# Patient Record
Sex: Male | Born: 1985 | Race: Black or African American | Hispanic: No | Marital: Single | Smoking: Former smoker
Health system: Southern US, Community
[De-identification: ages and names within clinical notes are randomized; demographics above are authoritative.]

## PROBLEM LIST (undated history)

## (undated) DIAGNOSIS — I10 Essential (primary) hypertension: Secondary | ICD-10-CM

## (undated) DIAGNOSIS — J40 Bronchitis, not specified as acute or chronic: Secondary | ICD-10-CM

---
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
## 2005-04-06 ENCOUNTER — Emergency Department (HOSPITAL_COMMUNITY): Admission: EM | Admit: 2005-04-06 | Discharge: 2005-04-06 | Payer: Self-pay | Admitting: Emergency Medicine

## 2005-07-21 ENCOUNTER — Emergency Department (HOSPITAL_COMMUNITY): Admission: EM | Admit: 2005-07-21 | Discharge: 2005-07-21 | Payer: Self-pay | Admitting: Emergency Medicine

## 2006-02-04 ENCOUNTER — Emergency Department (HOSPITAL_COMMUNITY): Admission: EM | Admit: 2006-02-04 | Discharge: 2006-02-04 | Payer: Self-pay | Admitting: Emergency Medicine

## 2006-10-08 ENCOUNTER — Emergency Department (HOSPITAL_COMMUNITY): Admission: EM | Admit: 2006-10-08 | Discharge: 2006-10-08 | Payer: Self-pay | Admitting: Emergency Medicine

## 2008-03-01 ENCOUNTER — Emergency Department (HOSPITAL_COMMUNITY): Admission: EM | Admit: 2008-03-01 | Discharge: 2008-03-01 | Payer: Self-pay | Admitting: Emergency Medicine

## 2008-05-28 ENCOUNTER — Emergency Department (HOSPITAL_COMMUNITY): Admission: EM | Admit: 2008-05-28 | Discharge: 2008-05-28 | Payer: Self-pay | Admitting: Emergency Medicine

## 2009-04-10 ENCOUNTER — Emergency Department (HOSPITAL_COMMUNITY): Admission: EM | Admit: 2009-04-10 | Discharge: 2009-04-10 | Payer: Self-pay | Admitting: Emergency Medicine

## 2009-10-02 ENCOUNTER — Emergency Department (HOSPITAL_COMMUNITY): Admission: EM | Admit: 2009-10-02 | Discharge: 2009-10-02 | Payer: Self-pay | Admitting: Emergency Medicine

## 2010-10-18 ENCOUNTER — Emergency Department (HOSPITAL_COMMUNITY): Admit: 2010-10-18 | Discharge: 2010-10-18 | Disposition: A | Discharge status: Home / Self Care | Payer: Self-pay

## 2010-10-18 ENCOUNTER — Emergency Department (HOSPITAL_COMMUNITY): Admission: EM | Admit: 2010-10-18 | Payer: Self-pay | Source: Home / Self Care

## 2010-10-18 ENCOUNTER — Emergency Department (HOSPITAL_COMMUNITY)
Admission: EM | Admit: 2010-10-18 | Discharge: 2010-10-18 | Disposition: A | Discharge status: Home / Self Care | Payer: Self-pay | Attending: Emergency Medicine | Admitting: Emergency Medicine

## 2010-10-18 DIAGNOSIS — R42 Dizziness and giddiness: Secondary | ICD-10-CM | POA: Insufficient documentation

## 2010-10-18 DIAGNOSIS — J020 Streptococcal pharyngitis: Secondary | ICD-10-CM | POA: Insufficient documentation

## 2010-10-18 DIAGNOSIS — R05 Cough: Secondary | ICD-10-CM | POA: Insufficient documentation

## 2010-10-18 DIAGNOSIS — R599 Enlarged lymph nodes, unspecified: Secondary | ICD-10-CM | POA: Insufficient documentation

## 2010-10-18 DIAGNOSIS — R059 Cough, unspecified: Secondary | ICD-10-CM | POA: Insufficient documentation

## 2010-10-18 DIAGNOSIS — R091 Pleurisy: Secondary | ICD-10-CM | POA: Insufficient documentation

## 2010-10-18 DIAGNOSIS — R079 Chest pain, unspecified: Secondary | ICD-10-CM | POA: Insufficient documentation

## 2010-10-18 DIAGNOSIS — R51 Headache: Secondary | ICD-10-CM | POA: Insufficient documentation

## 2010-10-18 DIAGNOSIS — R6883 Chills (without fever): Secondary | ICD-10-CM | POA: Insufficient documentation

## 2010-10-18 DIAGNOSIS — R0602 Shortness of breath: Secondary | ICD-10-CM | POA: Insufficient documentation

## 2010-10-18 DIAGNOSIS — J45909 Unspecified asthma, uncomplicated: Secondary | ICD-10-CM | POA: Insufficient documentation

## 2010-10-18 DIAGNOSIS — F17200 Nicotine dependence, unspecified, uncomplicated: Secondary | ICD-10-CM | POA: Insufficient documentation

## 2010-10-18 LAB — RAPID STREP SCREEN (MED CTR MEBANE ONLY): Streptococcus, Group A Screen (Direct): POSITIVE — AB

## 2010-10-30 ENCOUNTER — Emergency Department (HOSPITAL_COMMUNITY)
Admission: EM | Admit: 2010-10-30 | Discharge: 2010-10-30 | Disposition: A | Discharge status: Home / Self Care | Payer: Self-pay | Attending: Emergency Medicine | Admitting: Emergency Medicine

## 2010-10-30 DIAGNOSIS — R509 Fever, unspecified: Secondary | ICD-10-CM | POA: Insufficient documentation

## 2010-10-30 DIAGNOSIS — R112 Nausea with vomiting, unspecified: Secondary | ICD-10-CM | POA: Insufficient documentation

## 2010-10-30 DIAGNOSIS — R51 Headache: Secondary | ICD-10-CM | POA: Insufficient documentation

## 2010-10-30 DIAGNOSIS — J209 Acute bronchitis, unspecified: Secondary | ICD-10-CM | POA: Insufficient documentation

## 2010-10-30 DIAGNOSIS — F17200 Nicotine dependence, unspecified, uncomplicated: Secondary | ICD-10-CM | POA: Insufficient documentation

## 2011-01-31 NOTE — Discharge Summary (Signed)
[facility]  Patient:    Robert Drake, Robert Drake Visit Number: [id number] MRN: [id number]          Service Type: MED Location: 3A A327 01 Attending Physician:  Milana Obey Dictated by:   John Giovanni, M.D. Admit Date:  06/29/2001 Discharge Date: 06/30/2001                             Discharge Summary  BRIEF HISTORY:  This 25 year old was admitted to the hospital with asthma.  He had a benign 2 day hospital course extending from June 29, 2001, to June 30, 2001.  His x-rays showed some bronchitic change with some questionable prominence with right perihilar markings.  CBC and Met-7 were essentially normal.  He did not respond to Ventolin in the ER and therefore he was considered to be in status asthmaticus and was put on continued nebulizer treatment with Ventolin initially and then albuterol treatments q.2-4 hours.  He was also given Rocephin 1 gram IV q.24h. and put on Solu-Medrol 125 mg IV q.6h.. after already receiving 60 mg of prednisone in the ER.  He was on Tylenol p.r.n. and IV normal saline at 50 cc/hr.  He still had wheezing the night of admission but by the following morning he was totally clear, felt well with oxygen saturations at 97%.  He was felt to be stable to send home later in the day and arrangements were made for same.  DISCHARGE MEDICATIONS:  He is to take: 1. Ceftin 250 b.i.d. for 10 days (#20, no refills). 2. Prednisone 10 mg b.i.d. (#30, no refills). 3. Albuterol nebulizer treatments q.i.d. (refill for the year with an    prescription for a nebulizer as well). 4. He is to continue on singulair 10 mg q.d. 5. Use flovent 2 puffs b.i.d. at present.  FOLLOWUP:  Follow up will be with me in the office in 2 days.  FINAL DISCHARGE DIAGNOSES: 1. Status asthmaticus. 2. Question pneumonia. Dictated by:   John Giovanni, M.D. Attending Physician:  Milana Obey DD:  06/30/01 TD:  06/30/01 Job:  [id number]

## 2011-01-31 NOTE — H&P (Signed)
[facility]  Patient:    Robert Drake, Robert Drake Visit Number: [id number] MRN: [id number]          Service Type: MED Location: 3A A327 01 Attending Physician:  Hilario Quarry Dictated by:   John Giovanni, M.D. Admit Date:  06/29/2001                           History and Physical  HISTORY OF PRESENT ILLNESS:  A 25 year old who became sick about three days ago initially with a sore throat. He is on Singulair 10 mg q.d. as a preventive for asthma. At that time, we added Flovent two puffs b.i.d. He began to get worse yesterday. He started to wheeze and had to use Albuterol inhalers and today became short of breath enough that he had to present to the ER.  He has a long history of asthma, admissions for pneumonia.  OBJECTIVE:  VITAL SIGNS:  Temperature 97.6, pulse 96, respiratory rate 24, blood pressure 127/61, O2 sat was 97% on room air. Recheck 96% room air.  GENERAL:  At the time of my exam, he was cooperative, oriented, alert, well-developed, well-nourished, in fact somewhat obese in no acute distress.  CHEST:  There were expiratory rhonchi heard throughout the chest but is moving air well, no intercostal traction. No use of accessory muscles for respiration, breathing easily without O2 on room air.  HEART:  Regular rhythm without murmur, rate about 70.  ABDOMEN:  Soft without hepatosplenomegaly or mass, no tenderness.  EXTREMITIES:  Normal. Skin turgor normal.  Mucous membranes moist. ______.  Chest x-ray showed bronchitic changes with a question of a mild perihilar infiltrate in the superior aspect of the right pulmonary hilum. Blood work is pending.  He was treated with a number of albuterol treatments in the ER and continued to have wheezing. He was given prednisone 60 mg.  ASSESSMENT:  Status asthmaticus.  PLAN:  Solu-Medrol IV. Rocephin 1 mg IV q. 24 hours. Albuterol ______ neb treatment for an hour and then neb treatments q. 4h.  Repeat CBC, ______ in a.m. Dictated by:   John Giovanni, M.D. Attending Physician:  Hilario Quarry DD:  06/29/01 TD:  06/29/01 Job: [id number]

## 2011-08-24 ENCOUNTER — Emergency Department (HOSPITAL_COMMUNITY)
Admission: EM | Admit: 2011-08-24 | Discharge: 2011-08-24 | Disposition: A | Discharge status: Home / Self Care | Payer: Self-pay | Attending: Emergency Medicine | Admitting: Emergency Medicine

## 2011-08-24 ENCOUNTER — Encounter: Payer: Self-pay | Admitting: *Deleted

## 2011-08-24 DIAGNOSIS — F17200 Nicotine dependence, unspecified, uncomplicated: Secondary | ICD-10-CM | POA: Insufficient documentation

## 2011-08-24 DIAGNOSIS — K047 Periapical abscess without sinus: Secondary | ICD-10-CM | POA: Insufficient documentation

## 2011-08-24 DIAGNOSIS — J45909 Unspecified asthma, uncomplicated: Secondary | ICD-10-CM | POA: Insufficient documentation

## 2011-08-24 MED ORDER — HYDROCODONE-ACETAMINOPHEN 5-325 MG PO TABS: 1.0000 | ORAL_TABLET | Freq: Once | ORAL | Status: DC

## 2011-08-24 MED ORDER — AMOXICILLIN 250 MG PO CAPS
500.0000 mg | ORAL_CAPSULE | Freq: Once | ORAL | Status: AC
  Administered 2011-08-24: 500 mg via ORAL
  Filled 2011-08-24: qty 2

## 2011-08-24 MED ORDER — AMOXICILLIN 500 MG PO CAPS: 500.0000 mg | ORAL_CAPSULE | Freq: Three times a day (TID) | ORAL | Status: DC

## 2011-08-24 MED ORDER — HYDROCODONE-ACETAMINOPHEN 5-325 MG PO TABS: 1.0000 | ORAL_TABLET | ORAL | Status: AC | PRN

## 2011-08-24 MED ORDER — HYDROCODONE-ACETAMINOPHEN 5-325 MG PO TABS
1.0000 | ORAL_TABLET | Freq: Once | ORAL | Status: AC
  Administered 2011-08-24: 1 via ORAL
  Filled 2011-08-24: qty 1

## 2011-08-24 NOTE — ED Provider Notes (Signed)
History     CSN: [id number] Arrival date & time: 08/24/2011 10:38 AM   First MD Initiated Contact with Patient 08/24/11 1040      Chief Complaint  Patient presents with  . Dental Pain    (Consider location/radiation/quality/duration/timing/severity/associated sxs/prior treatment) Patient is a 25 y.o. male presenting with tooth pain. The history is provided by the patient.  Dental PainThe primary symptoms include mouth pain and dental injury. Primary symptoms do not include headaches, fever, shortness of breath or sore throat. Primary symptoms comment: He lost the filling in a tooth months ago, which has slowly started to have increased pain.  He now has cold sensitivity and swelling over the past 2 days. The symptoms are unchanged. The symptoms occur constantly.  Additional symptoms include: dental sensitivity to temperature, gum swelling, gum tenderness and jaw pain. Additional symptoms do not include: facial swelling, trouble swallowing and ear pain.     He has found no alleviators,  Has tried ibuprofen and tylenol.  Drinking cold drinks make it worse.  Past Medical History  Diagnosis Date  . Asthma     History reviewed. No pertinent past surgical history.  History reviewed. No pertinent family history.  History  Substance Use Topics  . Smoking status: Current Everyday Smoker -- 0.5 packs/day  . Smokeless tobacco: Not on file  . Alcohol Use: No      Review of Systems  Constitutional: Negative for fever.  HENT: Negative for ear pain, congestion, sore throat, facial swelling, trouble swallowing and neck pain.   Eyes: Negative.   Respiratory: Negative for chest tightness and shortness of breath.   Cardiovascular: Negative for chest pain.  Gastrointestinal: Negative for nausea and abdominal pain.  Genitourinary: Negative.   Musculoskeletal: Negative for joint swelling and arthralgias.  Skin: Negative.  Negative for rash and wound.  Neurological: Negative for dizziness,  weakness, light-headedness, numbness and headaches.  Hematological: Negative.   Psychiatric/Behavioral: Negative.     Allergies  Robitussin  Home Medications   Current Outpatient Rx  Name Route Sig Dispense Refill  . AMOXICILLIN 500 MG PO CAPS Oral Take 1 capsule (500 mg total) by mouth 3 (three) times daily. 21 capsule 0  . HYDROCODONE-ACETAMINOPHEN 5-325 MG PO TABS Oral Take 1 tablet by mouth once. 15 tablet 0    BP 141/79  Pulse 71  Temp(Src) 98.2 F (36.8 C) (Oral)  Resp 18  Ht 5\' 9"  (1.753 m)  Wt 244 lb 8 oz (110.904 kg)  BMI 36.11 kg/m2  SpO2 98%  Physical Exam  Nursing note and vitals reviewed. Constitutional: He is oriented to person, place, and time. He appears well-developed and well-nourished. No distress.  HENT:  Head: Normocephalic and atraumatic.  Right Ear: Tympanic membrane and external ear normal.  Left Ear: Tympanic membrane and external ear normal.  Mouth/Throat: Oropharynx is clear and moist and mucous membranes are normal. No oral lesions. Dental abscesses present.    Eyes: Conjunctivae are normal.  Neck: Normal range of motion. Neck supple.  Cardiovascular: Normal rate, regular rhythm, normal heart sounds and intact distal pulses.   Pulmonary/Chest: Effort normal and breath sounds normal. He has no wheezes.  Abdominal: Soft. Bowel sounds are normal. He exhibits no distension. There is no tenderness.  Musculoskeletal: Normal range of motion.  Lymphadenopathy:    He has no cervical adenopathy.  Neurological: He is alert and oriented to person, place, and time.  Skin: Skin is warm and dry. No erythema.  Psychiatric: He has a normal mood and  affect.    ED Course  Procedures (including critical care time)  Labs Reviewed - No data to display No results found.   1. Dental abscess       MDM  Dental abscess.        Candis Musa, PA 08/24/11 1109

## 2011-08-24 NOTE — ED Notes (Signed)
C/o left lower toothache since yesterday but worse this am.

## 2011-08-25 NOTE — ED Provider Notes (Signed)
Medical screening examination/treatment/procedure(s) were performed by non-physician practitioner and as supervising physician I was immediately available for consultation/collaboration.   Kashton Mcartor W. Manford Sprong, MD 08/25/11 1746 

## 2011-09-03 ENCOUNTER — Encounter (HOSPITAL_COMMUNITY): Payer: Self-pay | Admitting: Emergency Medicine

## 2011-09-03 ENCOUNTER — Emergency Department (HOSPITAL_COMMUNITY)
Admission: EM | Admit: 2011-09-03 | Discharge: 2011-09-03 | Disposition: A | Discharge status: Home / Self Care | Payer: Self-pay | Attending: Emergency Medicine | Admitting: Emergency Medicine

## 2011-09-03 DIAGNOSIS — J111 Influenza due to unidentified influenza virus with other respiratory manifestations: Secondary | ICD-10-CM | POA: Insufficient documentation

## 2011-09-03 DIAGNOSIS — Z79899 Other long term (current) drug therapy: Secondary | ICD-10-CM | POA: Insufficient documentation

## 2011-09-03 DIAGNOSIS — J40 Bronchitis, not specified as acute or chronic: Secondary | ICD-10-CM | POA: Insufficient documentation

## 2011-09-03 DIAGNOSIS — F17200 Nicotine dependence, unspecified, uncomplicated: Secondary | ICD-10-CM | POA: Insufficient documentation

## 2011-09-03 DIAGNOSIS — J45909 Unspecified asthma, uncomplicated: Secondary | ICD-10-CM | POA: Insufficient documentation

## 2011-09-03 MED ORDER — ACETAMINOPHEN 325 MG PO TABS
650.0000 mg | ORAL_TABLET | Freq: Once | ORAL | Status: AC
  Administered 2011-09-03: 650 mg via ORAL
  Filled 2011-09-03: qty 2

## 2011-09-03 MED ORDER — PREDNISONE 20 MG PO TABS
60.0000 mg | ORAL_TABLET | Freq: Once | ORAL | Status: AC
  Administered 2011-09-03: 60 mg via ORAL
  Filled 2011-09-03: qty 3

## 2011-09-03 MED ORDER — PREDNISONE 10 MG PO TABS: ORAL_TABLET | ORAL | Status: DC

## 2011-09-03 MED ORDER — ALBUTEROL SULFATE HFA 108 (90 BASE) MCG/ACT IN AERS
2.0000 | INHALATION_SPRAY | RESPIRATORY_TRACT | Status: DC
  Administered 2011-09-03: 2 via RESPIRATORY_TRACT
  Filled 2011-09-03: qty 6.7

## 2011-09-03 MED ORDER — HYDROCOD POLST-CHLORPHEN POLST 10-8 MG/5ML PO LQCR
5.0000 mL | Freq: Once | ORAL | Status: AC
  Administered 2011-09-03: 5 mL via ORAL
  Filled 2011-09-03: qty 5

## 2011-09-03 MED ORDER — PROMETHAZINE-DM 6.25-15 MG/5ML PO SYRP: ORAL_SOLUTION | ORAL | Status: DC

## 2011-09-03 NOTE — ED Notes (Signed)
Sick since Saturday, sore throat, fever, chills, cough.  Alert, NAD

## 2011-09-03 NOTE — ED Notes (Signed)
Pt c/o flu -like sx x 3 days. Congestion/body aches/fever. Denies n/v/d. nad noted.

## 2011-09-03 NOTE — ED Provider Notes (Addendum)
History     CSN: [id number] Arrival date & time: 09/03/2011  2:45 PM   First MD Initiated Contact with Patient 09/03/11 1529      Chief Complaint  Patient presents with  . Influenza    (Consider location/radiation/quality/duration/timing/severity/associated sxs/prior treatment) Patient is a 25 y.o. male presenting with flu symptoms.  Influenza This is a new problem. The current episode started in the past 7 days. The problem occurs constantly. The problem has been gradually worsening. Associated symptoms include congestion, coughing, a fever and myalgias. Pertinent negatives include no abdominal pain, arthralgias, chest pain or neck pain. The symptoms are aggravated by nothing. He has tried acetaminophen for the symptoms. The treatment provided no relief.    Past Medical History  Diagnosis Date  . Asthma     History reviewed. No pertinent past surgical history.  History reviewed. No pertinent family history.  History  Substance Use Topics  . Smoking status: Current Everyday Smoker -- 0.5 packs/day  . Smokeless tobacco: Not on file  . Alcohol Use: No      Review of Systems  Constitutional: Positive for fever. Negative for activity change.       All ROS Neg except as noted in HPI  HENT: Positive for congestion and postnasal drip. Negative for nosebleeds and neck pain.   Eyes: Negative for photophobia and discharge.  Respiratory: Positive for cough and wheezing. Negative for shortness of breath.   Cardiovascular: Negative for chest pain and palpitations.  Gastrointestinal: Negative for abdominal pain and blood in stool.  Genitourinary: Negative for dysuria, frequency and hematuria.  Musculoskeletal: Positive for myalgias. Negative for back pain and arthralgias.  Skin: Negative.   Neurological: Negative for dizziness, seizures and speech difficulty.  Psychiatric/Behavioral: Negative for hallucinations and confusion.    Allergies  Robitussin  Home Medications    Current Outpatient Rx  Name Route Sig Dispense Refill  . ACETAMINOPHEN 500 MG PO TABS Oral Take 1,000 mg by mouth every 6 (six) hours as needed. Pain     . ALBUTEROL SULFATE HFA 108 (90 BASE) MCG/ACT IN AERS Inhalation Inhale 2 puffs into the lungs every 6 (six) hours as needed. Shortness of breath     . ALBUTEROL SULFATE (2.5 MG/3ML) 0.083% IN NEBU Nebulization Take 2.5 mg by nebulization every 6 (six) hours as needed. Shortness of breath     . HYDROCODONE-ACETAMINOPHEN 5-325 MG PO TABS Oral Take 1 tablet by mouth every 4 (four) hours as needed for pain. 15 tablet 0  . PSEUDOEPHEDRINE-APAP-DM 30-500-15 MG PO TABS Oral Take 1-2 tablets by mouth daily as needed. For cold symptoms     . PREDNISONE 10 MG PO TABS  6,5,4,3,2,1 - take with food 21 tablet 0  . PROMETHAZINE-DM 6.25-15 MG/5ML PO SYRP  2 tsp po q6h prn cough 120 mL 0    BP 153/78  Pulse 104  Temp 99.4 F (37.4 C)  Resp 20  Ht 5\' 9"  (1.753 m)  Wt 244 lb (110.678 kg)  BMI 36.03 kg/m2  SpO2 99%  Physical Exam  Nursing note and vitals reviewed. Constitutional: He is oriented to person, place, and time. He appears well-developed and well-nourished.  Non-toxic appearance.  HENT:  Head: Normocephalic.  Right Ear: Tympanic membrane and external ear normal.  Left Ear: Tympanic membrane and external ear normal.  Nose: No epistaxis.       Nasal congestion present  Eyes: EOM and lids are normal. Pupils are equal, round, and reactive to light.  Neck: Normal range of  motion. Neck supple. Carotid bruit is not present.  Cardiovascular: Normal rate, regular rhythm, normal heart sounds, intact distal pulses and normal pulses.   Pulmonary/Chest: No respiratory distress. He has wheezes. He has rhonchi in the right upper field, the right middle field, the right lower field, the left upper field, the left middle field and the left lower field.  Abdominal: Soft. Bowel sounds are normal. There is no tenderness. There is no guarding.   Musculoskeletal: Normal range of motion.  Lymphadenopathy:       Head (right side): No submandibular adenopathy present.       Head (left side): No submandibular adenopathy present.    He has no cervical adenopathy.  Neurological: He is alert and oriented to person, place, and time. He has normal strength. No cranial nerve deficit or sensory deficit.  Skin: Skin is warm and dry.  Psychiatric: He has a normal mood and affect. His speech is normal.    ED Course  Procedures (including critical care time) Pulse oximetry 99% on room air. Within normal limits by my interpretation. Labs Reviewed - No data to display No results found.   1. Influenza   2. Bronchitis       MDM  I have reviewed nursing notes, vital signs, and all appropriate lab and imaging results for this patient.     Patient is speaking in complete sentences. He feels some better than when he arrived in the emergency department. He has active wheezing present. This was treated with albuterol and prednisone. Repeat examination reveals significant improvement. Patient is to be discharged home he is to continue his albuterol nebulizers. Nebulizer machine is available to the patient.   Kathie Dike, PA 09/03/11 1737  Kathie Dike, PA 10/29/11 1550

## 2011-09-05 NOTE — ED Provider Notes (Signed)
Medical screening examination/treatment/procedure(s) were performed by non-physician practitioner and as supervising physician I was immediately available for consultation/collaboration.  Neriyah Cercone S. Chesney Suares, MD 09/05/11 0449 

## 2011-10-29 NOTE — ED Provider Notes (Signed)
Medical screening examination/treatment/procedure(s) were performed by non-physician practitioner and as supervising physician I was immediately available for consultation/collaboration.  Nicoletta Dress. Colon Branch, MD 10/29/11 2037

## 2012-06-03 ENCOUNTER — Emergency Department (HOSPITAL_COMMUNITY): Payer: Self-pay

## 2012-06-03 ENCOUNTER — Encounter (HOSPITAL_COMMUNITY): Payer: Self-pay | Admitting: *Deleted

## 2012-06-03 ENCOUNTER — Emergency Department (HOSPITAL_COMMUNITY)
Admission: EM | Admit: 2012-06-03 | Discharge: 2012-06-03 | Disposition: A | Discharge status: Home / Self Care | Payer: Self-pay | Attending: Emergency Medicine | Admitting: Emergency Medicine

## 2012-06-03 DIAGNOSIS — I10 Essential (primary) hypertension: Secondary | ICD-10-CM

## 2012-06-03 DIAGNOSIS — Z79899 Other long term (current) drug therapy: Secondary | ICD-10-CM | POA: Insufficient documentation

## 2012-06-03 DIAGNOSIS — K529 Noninfective gastroenteritis and colitis, unspecified: Secondary | ICD-10-CM

## 2012-06-03 DIAGNOSIS — R109 Unspecified abdominal pain: Secondary | ICD-10-CM | POA: Insufficient documentation

## 2012-06-03 DIAGNOSIS — J45909 Unspecified asthma, uncomplicated: Secondary | ICD-10-CM | POA: Insufficient documentation

## 2012-06-03 MED ORDER — LISINOPRIL 20 MG PO TABS: 20.0000 mg | ORAL_TABLET | Freq: Every day | ORAL | Status: DC

## 2012-06-03 MED ORDER — HYOSCYAMINE SULFATE 0.125 MG PO TABS: 0.1250 mg | ORAL_TABLET | ORAL | Status: DC | PRN

## 2012-06-03 NOTE — ED Provider Notes (Signed)
History     CSN: [id number]  Arrival date & time 06/03/12  1342   First MD Initiated Contact with Patient 06/03/12 1410      Chief Complaint  Patient presents with  . Abdominal Pain    (Consider location/radiation/quality/duration/timing/severity/associated sxs/prior treatment) Patient is a 26 y.o. male presenting with abdominal pain. The history is provided by the patient (pt complains of abd cramping.). No language interpreter was used.  Abdominal Pain The primary symptoms of the illness include abdominal pain. The primary symptoms of the illness do not include fatigue or diarrhea. The current episode started 2 days ago. The onset of the illness was sudden. The problem has not changed since onset. Associated with: gas. The patient states that she believes she is currently not pregnant. The patient has not had a change in bowel habit. Symptoms associated with the illness do not include heartburn, hematuria, frequency or back pain. Significant associated medical issues do not include PUD.    Past Medical History  Diagnosis Date  . Asthma     History reviewed. No pertinent past surgical history.  History reviewed. No pertinent family history.  History  Substance Use Topics  . Smoking status: Current Every Day Smoker -- 0.5 packs/day  . Smokeless tobacco: Not on file  . Alcohol Use: No      Review of Systems  Constitutional: Negative for fatigue.  HENT: Negative for congestion, sinus pressure and ear discharge.   Eyes: Negative for discharge.  Respiratory: Negative for cough.   Cardiovascular: Negative for chest pain.  Gastrointestinal: Positive for abdominal pain. Negative for heartburn and diarrhea.  Genitourinary: Negative for frequency and hematuria.  Musculoskeletal: Negative for back pain.  Skin: Negative for rash.  Neurological: Negative for seizures and headaches.  Hematological: Negative.   Psychiatric/Behavioral: Negative for hallucinations.    Allergies    Robitussin  Home Medications   Current Outpatient Rx  Name Route Sig Dispense Refill  . ALBUTEROL SULFATE HFA 108 (90 BASE) MCG/ACT IN AERS Inhalation Inhale 2 puffs into the lungs every 6 (six) hours as needed. Shortness of breath     . ALBUTEROL SULFATE (2.5 MG/3ML) 0.083% IN NEBU Nebulization Take 2.5 mg by nebulization every 6 (six) hours as needed. Shortness of breath     . ACETAMINOPHEN 500 MG PO TABS Oral Take 1,000 mg by mouth every 6 (six) hours as needed. Pain    . HYOSCYAMINE SULFATE 0.125 MG PO TABS Oral Take 1 tablet (0.125 mg total) by mouth every 4 (four) hours as needed for cramping. 20 tablet 0  . LISINOPRIL 20 MG PO TABS Oral Take 1 tablet (20 mg total) by mouth daily. 30 tablet 1  . PREDNISONE 10 MG PO TABS  6,5,4,3,2,1 - take with food 21 tablet 0  . PROMETHAZINE-DM 6.25-15 MG/5ML PO SYRP  2 tsp po q6h prn cough 120 mL 0    BP 148/96  Pulse 65  Temp 98.4 F (36.9 C) (Oral)  Resp 20  Ht 5\' 8"  (1.727 m)  Wt 240 lb 3 oz (108.948 kg)  BMI 36.52 kg/m2  SpO2 100%  Physical Exam  Constitutional: He is oriented to person, place, and time. He appears well-developed.  HENT:  Head: Normocephalic and atraumatic.  Eyes: Conjunctivae normal and EOM are normal. No scleral icterus.  Neck: Neck supple. No thyromegaly present.  Cardiovascular: Normal rate and regular rhythm.  Exam reveals no gallop and no friction rub.   No murmur heard. Pulmonary/Chest: No stridor. He has no wheezes.  He has no rales. He exhibits no tenderness.  Abdominal: He exhibits no distension. There is no tenderness. There is no rebound.  Musculoskeletal: Normal range of motion. He exhibits no edema.  Lymphadenopathy:    He has no cervical adenopathy.  Neurological: He is oriented to person, place, and time. Coordination normal.  Skin: No rash noted. No erythema.  Psychiatric: He has a normal mood and affect. His behavior is normal.    ED Course  Procedures (including critical care time)  Labs  Reviewed - No data to display Dg Abd Acute W/chest  06/03/2012  *RADIOLOGY REPORT*  Clinical Data: Right abdominal pain  ACUTE ABDOMEN SERIES (ABDOMEN 2 VIEW & CHEST 1 VIEW)  Comparison: Chest radiograph dated 10/18/2010  Findings: Lungs are clear. No pleural effusion or pneumothorax.  Cardiomediastinal silhouette is within normal limits.  Nonobstructive bowel gas pattern.  No evidence of free air under the diaphragm on the upright view.  Visualized osseous structures are within normal limits.  IMPRESSION: No evidence of acute cardiopulmonary disease.  No evidence of small bowel obstruction or free air.   Original Report Authenticated By: Charline Bills, M.D.      1. Gastroenteritis   2. Hypertension       MDM          Benny Lennert, MD 06/03/12 1520

## 2012-06-03 NOTE — ED Notes (Signed)
abd discomfort, Nausea, no vomiting,  Felt like he had a "virus " last week.  Feels that is better but cont to have abd discomfort. With gas and rectal pressure.

## 2012-06-26 ENCOUNTER — Emergency Department (HOSPITAL_COMMUNITY)
Admission: EM | Admit: 2012-06-26 | Discharge: 2012-06-26 | Disposition: A | Discharge status: Home / Self Care | Payer: Self-pay | Attending: Emergency Medicine | Admitting: Emergency Medicine

## 2012-06-26 ENCOUNTER — Encounter (HOSPITAL_COMMUNITY): Payer: Self-pay | Admitting: *Deleted

## 2012-06-26 DIAGNOSIS — Z888 Allergy status to other drugs, medicaments and biological substances status: Secondary | ICD-10-CM | POA: Insufficient documentation

## 2012-06-26 DIAGNOSIS — J45909 Unspecified asthma, uncomplicated: Secondary | ICD-10-CM | POA: Insufficient documentation

## 2012-06-26 DIAGNOSIS — M549 Dorsalgia, unspecified: Secondary | ICD-10-CM | POA: Insufficient documentation

## 2012-06-26 DIAGNOSIS — F17200 Nicotine dependence, unspecified, uncomplicated: Secondary | ICD-10-CM | POA: Insufficient documentation

## 2012-06-26 DIAGNOSIS — R300 Dysuria: Secondary | ICD-10-CM | POA: Insufficient documentation

## 2012-06-26 HISTORY — DX: Bronchitis, not specified as acute or chronic: J40

## 2012-06-26 LAB — URINALYSIS, ROUTINE W REFLEX MICROSCOPIC
Leukocytes, UA: NEGATIVE
Nitrite: NEGATIVE
Protein, ur: NEGATIVE mg/dL
Specific Gravity, Urine: 1.015 (ref 1.005–1.030)
Urobilinogen, UA: 0.2 mg/dL (ref 0.0–1.0)

## 2012-06-26 MED ORDER — CYCLOBENZAPRINE HCL 10 MG PO TABS: 10.0000 mg | ORAL_TABLET | Freq: Three times a day (TID) | ORAL | Status: DC | PRN

## 2012-06-26 MED ORDER — IBUPROFEN 800 MG PO TABS: 800.0000 mg | ORAL_TABLET | Freq: Three times a day (TID) | ORAL | Status: DC | PRN

## 2012-06-26 NOTE — ED Notes (Signed)
Intermittent burning with urination x 3 days.  Left sided lower back pain that started yesterday.  Denies injury.  Denies n/v/d.

## 2012-06-26 NOTE — ED Provider Notes (Signed)
History  This chart was scribed for Charles B. Bernette Mayers, MD by Shari Heritage. The patient was seen in room APFT24/APFT24. Patient's care was started at 0845.     CSN: [id number]  Arrival date & time 06/26/12  0820   First MD Initiated Contact with Patient 06/26/12 0845      Chief Complaint  Patient presents with  . Back Pain  . burning with urination      The history is provided by the patient. No language interpreter was used.    Robert Drake is a 26 y.o. male who presents to the Emergency Department complaining of waxing and waning, moderate, left lower back pain that radiates around to the front down to his left inguinal area onset yesterday morning. Patient says that sometimes pains is relieved with certain movements. There is associated burning dysuria that began 2-3 days ago. Patient denies any obvious injury or trauma to his back. He denies any testicular pain, penile discharge or emesis. Patient says that he hasn't had any unprotected sex in the past several weeks, but he was treated for gonorrhea/chlamydia 3 weeks ago.    Past Medical History  Diagnosis Date  . Asthma   . Bronchitis     History reviewed. No pertinent past surgical history.  No family history on file.  History  Substance Use Topics  . Smoking status: Current Every Day Smoker -- 0.5 packs/day  . Smokeless tobacco: Not on file  . Alcohol Use: No      Review of Systems A complete 10 system review of systems was obtained and all systems are negative except as noted in the HPI and PMH.   Allergies  Robitussin  Home Medications   Current Outpatient Rx  Name Route Sig Dispense Refill  . ACETAMINOPHEN 500 MG PO TABS Oral Take 1,000 mg by mouth every 6 (six) hours as needed. Pain    . ALBUTEROL SULFATE HFA 108 (90 BASE) MCG/ACT IN AERS Inhalation Inhale 2 puffs into the lungs every 6 (six) hours as needed. Shortness of breath     . ALBUTEROL SULFATE (2.5 MG/3ML) 0.083% IN NEBU Nebulization Take  2.5 mg by nebulization every 6 (six) hours as needed. Shortness of breath     . HYOSCYAMINE SULFATE 0.125 MG PO TABS Oral Take 1 tablet (0.125 mg total) by mouth every 4 (four) hours as needed for cramping. 20 tablet 0  . LISINOPRIL 20 MG PO TABS Oral Take 1 tablet (20 mg total) by mouth daily. 30 tablet 1  . PREDNISONE 10 MG PO TABS  6,5,4,3,2,1 - take with food 21 tablet 0  . PROMETHAZINE-DM 6.25-15 MG/5ML PO SYRP  2 tsp po q6h prn cough 120 mL 0    BP 101/86  Pulse 70  Temp 98.2 F (36.8 C) (Oral)  Resp 16  Ht 5\' 8"  (1.727 m)  Wt 234 lb (106.142 kg)  BMI 35.58 kg/m2  SpO2 100%  Physical Exam  Nursing note and vitals reviewed. Constitutional: He is oriented to person, place, and time. He appears well-developed and well-nourished.  HENT:  Head: Normocephalic and atraumatic.  Eyes: EOM are normal. Pupils are equal, round, and reactive to light.  Neck: Normal range of motion. Neck supple.  Cardiovascular: Normal rate, normal heart sounds and intact distal pulses.   Pulmonary/Chest: Effort normal and breath sounds normal.  Abdominal: Bowel sounds are normal. He exhibits no distension. There is no tenderness.  Genitourinary: Right testis shows no swelling and no tenderness. Left testis shows no  swelling and no tenderness. No discharge found.       No testicular swelling or tenderness. No discharge. No sores.  Musculoskeletal: Normal range of motion. He exhibits tenderness. He exhibits no edema.       Tender in left paraspinal muscles.   Neurological: He is alert and oriented to person, place, and time. He has normal strength. No cranial nerve deficit or sensory deficit.  Skin: Skin is warm and dry. No rash noted.  Psychiatric: He has a normal mood and affect.    ED Course  Procedures (including critical care time) DIAGNOSTIC STUDIES: Oxygen Saturation is 100% on room air, normal by my interpretation.    COORDINATION OF CARE: 8:52am- Patient informed of current plan for  treatment and evaluation and agrees with plan at this time.     Labs Reviewed  URINALYSIS, ROUTINE W REFLEX MICROSCOPIC  GC/CHLAMYDIA PROBE AMP, URINE   No results found.   No diagnosis found.    MDM  UA neg for blood or infection. Doubt kidney stone. GC/C sent to ensure adequate treatment from recently reported infection. Pain medications as needed.       I personally performed the services described in the documentation, which were scribed in my presence. The recorded information has been reviewed and considered.     Charles B. Bernette Mayers, MD 06/26/12 [phone]

## 2012-06-28 LAB — GC/CHLAMYDIA PROBE AMP, URINE
Chlamydia, Swab/Urine, PCR: NEGATIVE
GC Probe Amp, Urine: NEGATIVE

## 2012-10-06 ENCOUNTER — Emergency Department (HOSPITAL_COMMUNITY)
Admission: EM | Admit: 2012-10-06 | Discharge: 2012-10-06 | Disposition: A | Discharge status: Home / Self Care | Payer: Self-pay | Attending: Emergency Medicine | Admitting: Emergency Medicine

## 2012-10-06 ENCOUNTER — Encounter (HOSPITAL_COMMUNITY): Payer: Self-pay | Admitting: Emergency Medicine

## 2012-10-06 DIAGNOSIS — Z79899 Other long term (current) drug therapy: Secondary | ICD-10-CM | POA: Insufficient documentation

## 2012-10-06 DIAGNOSIS — J3489 Other specified disorders of nose and nasal sinuses: Secondary | ICD-10-CM | POA: Insufficient documentation

## 2012-10-06 DIAGNOSIS — J45901 Unspecified asthma with (acute) exacerbation: Secondary | ICD-10-CM | POA: Insufficient documentation

## 2012-10-06 DIAGNOSIS — F17200 Nicotine dependence, unspecified, uncomplicated: Secondary | ICD-10-CM | POA: Insufficient documentation

## 2012-10-06 DIAGNOSIS — J45909 Unspecified asthma, uncomplicated: Secondary | ICD-10-CM

## 2012-10-06 DIAGNOSIS — J029 Acute pharyngitis, unspecified: Secondary | ICD-10-CM | POA: Insufficient documentation

## 2012-10-06 MED ORDER — PREDNISONE 50 MG PO TABS
60.0000 mg | ORAL_TABLET | Freq: Once | ORAL | Status: AC
  Administered 2012-10-06: 60 mg via ORAL
  Filled 2012-10-06: qty 1

## 2012-10-06 MED ORDER — IPRATROPIUM BROMIDE 0.02 % IN SOLN
0.5000 mg | Freq: Once | RESPIRATORY_TRACT | Status: AC
  Administered 2012-10-06: 0.5 mg via RESPIRATORY_TRACT
  Filled 2012-10-06: qty 2.5

## 2012-10-06 MED ORDER — ALBUTEROL SULFATE HFA 108 (90 BASE) MCG/ACT IN AERS: 1.0000 | INHALATION_SPRAY | Freq: Four times a day (QID) | RESPIRATORY_TRACT | Status: DC | PRN

## 2012-10-06 MED ORDER — AZITHROMYCIN 250 MG PO TABS: 250.0000 mg | ORAL_TABLET | Freq: Every day | ORAL | Status: DC

## 2012-10-06 MED ORDER — PREDNISONE 50 MG PO TABS: ORAL_TABLET | ORAL | Status: DC

## 2012-10-06 MED ORDER — ALBUTEROL SULFATE (5 MG/ML) 0.5% IN NEBU
5.0000 mg | INHALATION_SOLUTION | Freq: Once | RESPIRATORY_TRACT | Status: AC
  Administered 2012-10-06: 5 mg via RESPIRATORY_TRACT
  Filled 2012-10-06: qty 1

## 2012-10-06 NOTE — ED Notes (Signed)
Patient complaining of cough and congestion since yesterday. States it is difficult to breathe.

## 2012-10-06 NOTE — ED Provider Notes (Signed)
History     CSN: [id number]  Arrival date & time 10/06/12  0865   First MD Initiated Contact with Patient 10/06/12 2008      Chief Complaint  Patient presents with  . Cough  . Nasal Congestion     Patient is a 27 y.o. male presenting with cough. The history is provided by the patient.  Cough This is a new problem. The current episode started more than 2 days ago. The problem occurs hourly. The problem has been gradually worsening. The cough is non-productive. Associated symptoms include sore throat and wheezing. Pertinent negatives include no chest pain.  pt reports cough/wheezing since last weekend (4 days ago) He reports cough, congestion and wheezing He reports initially the symptoms began with nasal congestion and sore throat.  He denies current fever. He reports the sore throat/congestion improved but cough is worsening and he feels SOB No active CP at this time  Past Medical History  Diagnosis Date  . Asthma   . Bronchitis     History reviewed. No pertinent past surgical history.  History reviewed. No pertinent family history.  History  Substance Use Topics  . Smoking status: Current Every Day Smoker -- 0.5 packs/day  . Smokeless tobacco: Not on file  . Alcohol Use: No      Review of Systems  Constitutional: Negative for fever.  HENT: Positive for sore throat.   Respiratory: Positive for cough and wheezing.   Cardiovascular: Negative for chest pain.  Gastrointestinal: Negative for vomiting.  Neurological: Negative for weakness.  Psychiatric/Behavioral: Negative for agitation.  All other systems reviewed and are negative.    Allergies  Robitussin  Home Medications   Current Outpatient Rx  Name  Route  Sig  Dispense  Refill  . ALBUTEROL SULFATE HFA 108 (90 BASE) MCG/ACT IN AERS   Inhalation   Inhale 2 puffs into the lungs every 6 (six) hours as needed. Shortness of breath          . ALBUTEROL SULFATE (2.5 MG/3ML) 0.083% IN NEBU   Nebulization  Take 2.5 mg by nebulization every 6 (six) hours as needed. Shortness of breath          . IBUPROFEN 800 MG PO TABS   Oral   Take 1 tablet (800 mg total) by mouth every 8 (eight) hours as needed for pain.   30 tablet   0   . PHENYLEPHRINE-DM-GG-APAP 5-10-200-325 MG PO TABS   Oral   Take 1-2 tablets by mouth daily as needed. For cold relief           BP 160/106  Pulse 69  Temp 98.6 F (37 C) (Oral)  Resp 22  Ht 5\' 9"  (1.753 m)  Wt 234 lb (106.142 kg)  BMI 34.56 kg/m2  SpO2 97%  Physical Exam CONSTITUTIONAL: Well developed/well nourished HEAD AND FACE: Normocephalic/atraumatic EYES: EOMI/PERRL ENMT: Mucous membranes moist, nasal congestion, uvula midline, pharynx nonerythematous NECK: supple no meningeal signs SPINE:entire spine nontender CV: S1/S2 noted, no murmurs/rubs/gallops noted LUNGS: coarse wheezing noted bilaterally with mild tachypnea noted.   ABDOMEN: soft, nontender, no rebound or guarding GU:no cva tenderness NEURO: Pt is awake/alert, moves all extremitiesx4 EXTREMITIES: pulses normal, full ROM, no edema to either lower extremity SKIN: warm, color normal PSYCH: no abnormalities of mood noted  ED Course  Procedures  8:56 PM Pt with recent flu like illness now with residual coughing/wheezing and suspect asthma exacberation.  He was counseled on smoking cessation.  Will give nebs/prednisone and reassess  Pt improved.  His wheezing is resolving . He is in no distress.  He does have brief rales in left base, but otherwise well appearing.  Will treat with azithromycin and prednisone/albuterol Pt agreeable    MDM  Nursing notes including past medical history and social history reviewed and considered in documentation Previous records reviewed and considered - previous xray findings reviewed         Joya Gaskins, MD 10/06/12 2138

## 2013-05-04 ENCOUNTER — Encounter (HOSPITAL_COMMUNITY): Payer: Self-pay | Admitting: *Deleted

## 2013-05-04 ENCOUNTER — Emergency Department (HOSPITAL_COMMUNITY): Payer: Self-pay

## 2013-05-04 ENCOUNTER — Emergency Department (HOSPITAL_COMMUNITY)
Admission: EM | Admit: 2013-05-04 | Discharge: 2013-05-04 | Disposition: A | Discharge status: Home / Self Care | Payer: Self-pay | Attending: Emergency Medicine | Admitting: Emergency Medicine

## 2013-05-04 DIAGNOSIS — Z8709 Personal history of other diseases of the respiratory system: Secondary | ICD-10-CM | POA: Insufficient documentation

## 2013-05-04 DIAGNOSIS — J45901 Unspecified asthma with (acute) exacerbation: Secondary | ICD-10-CM | POA: Insufficient documentation

## 2013-05-04 DIAGNOSIS — J3489 Other specified disorders of nose and nasal sinuses: Secondary | ICD-10-CM | POA: Insufficient documentation

## 2013-05-04 DIAGNOSIS — Z79899 Other long term (current) drug therapy: Secondary | ICD-10-CM | POA: Insufficient documentation

## 2013-05-04 DIAGNOSIS — J069 Acute upper respiratory infection, unspecified: Secondary | ICD-10-CM | POA: Insufficient documentation

## 2013-05-04 DIAGNOSIS — J4521 Mild intermittent asthma with (acute) exacerbation: Secondary | ICD-10-CM

## 2013-05-04 DIAGNOSIS — Z792 Long term (current) use of antibiotics: Secondary | ICD-10-CM | POA: Insufficient documentation

## 2013-05-04 DIAGNOSIS — F17200 Nicotine dependence, unspecified, uncomplicated: Secondary | ICD-10-CM | POA: Insufficient documentation

## 2013-05-04 MED ORDER — PREDNISONE 10 MG PO TABS: ORAL_TABLET | ORAL | Status: DC

## 2013-05-04 MED ORDER — CIPROFLOXACIN HCL 250 MG PO TABS
500.0000 mg | ORAL_TABLET | Freq: Once | ORAL | Status: AC
  Administered 2013-05-04: 500 mg via ORAL
  Filled 2013-05-04: qty 2

## 2013-05-04 MED ORDER — PREDNISONE 50 MG PO TABS
60.0000 mg | ORAL_TABLET | Freq: Once | ORAL | Status: AC
  Administered 2013-05-04: 60 mg via ORAL
  Filled 2013-05-04: qty 1

## 2013-05-04 MED ORDER — ALBUTEROL SULFATE (5 MG/ML) 0.5% IN NEBU: 2.5000 mg | INHALATION_SOLUTION | Freq: Once | RESPIRATORY_TRACT | Status: DC

## 2013-05-04 MED ORDER — ALBUTEROL SULFATE (5 MG/ML) 0.5% IN NEBU
5.0000 mg | INHALATION_SOLUTION | Freq: Once | RESPIRATORY_TRACT | Status: AC
  Administered 2013-05-04: 5 mg via RESPIRATORY_TRACT
  Filled 2013-05-04: qty 1

## 2013-05-04 MED ORDER — CIPROFLOXACIN HCL 500 MG PO TABS: 500.0000 mg | ORAL_TABLET | Freq: Two times a day (BID) | ORAL | Status: DC

## 2013-05-04 MED ORDER — ALBUTEROL SULFATE HFA 108 (90 BASE) MCG/ACT IN AERS: 1.0000 | INHALATION_SPRAY | RESPIRATORY_TRACT | Status: DC | PRN

## 2013-05-04 NOTE — ED Provider Notes (Signed)
CSN: [id number]     Arrival date & time 05/04/13  1213 History     First MD Initiated Contact with Patient 05/04/13 1228     Chief Complaint  Patient presents with  . Cough  . Nasal Congestion   (Consider location/radiation/quality/duration/timing/severity/associated sxs/prior Treatment) Patient is a 27 y.o. male presenting with cough. The history is provided by the patient.  Cough Cough characteristics:  Productive Sputum characteristics:  White Severity:  Moderate Onset quality:  Gradual Duration:  3 days Timing:  Intermittent Progression:  Worsening Chronicity:  New Context: upper respiratory infection and weather changes   Relieved by:  Nothing Worsened by:  Nothing tried Associated symptoms: sinus congestion and wheezing   Associated symptoms: no chest pain, no eye discharge and no shortness of breath   Risk factors: no chemical exposure and no recent travel     Past Medical History  Diagnosis Date  . Asthma   . Bronchitis    History reviewed. No pertinent past surgical history. No family history on file. History  Substance Use Topics  . Smoking status: Current Every Day Smoker -- 0.50 packs/day  . Smokeless tobacco: Not on file  . Alcohol Use: No    Review of Systems  Constitutional: Negative for activity change.       All ROS Neg except as noted in HPI  HENT: Negative for nosebleeds and neck pain.   Eyes: Negative for photophobia and discharge.  Respiratory: Positive for cough and wheezing. Negative for shortness of breath.   Cardiovascular: Negative for chest pain and palpitations.  Gastrointestinal: Negative for abdominal pain and blood in stool.  Genitourinary: Negative for dysuria, frequency and hematuria.  Musculoskeletal: Negative for back pain and arthralgias.  Skin: Negative.   Neurological: Negative for dizziness, seizures and speech difficulty.  Psychiatric/Behavioral: Negative for hallucinations and confusion.    Allergies   Robitussin  Home Medications   Current Outpatient Rx  Name  Route  Sig  Dispense  Refill  . albuterol (PROVENTIL HFA;VENTOLIN HFA) 108 (90 BASE) MCG/ACT inhaler   Inhalation   Inhale 1-2 puffs into the lungs every 6 (six) hours as needed for wheezing.   1 Inhaler   0   . azithromycin (ZITHROMAX) 250 MG tablet   Oral   Take 1 tablet (250 mg total) by mouth daily. Take first 2 tablets together, then 1 every day until finished.   6 tablet   0   . predniSONE (DELTASONE) 50 MG tablet      One tablet PO daily   4 tablet   0    BP 148/89  Pulse 76  Temp(Src) 98.7 F (37.1 C) (Oral)  Resp 20  Ht 5\' 8"  (1.727 m)  Wt 225 lb (102.059 kg)  BMI 34.22 kg/m2  SpO2 96% Physical Exam  Nursing note and vitals reviewed. Constitutional: He is oriented to person, place, and time. He appears well-developed and well-nourished.  Non-toxic appearance.  HENT:  Head: Normocephalic.  Right Ear: Tympanic membrane and external ear normal.  Left Ear: Tympanic membrane and external ear normal.  Nasal congestion present.  Eyes: EOM and lids are normal. Pupils are equal, round, and reactive to light.  Neck: Normal range of motion. Neck supple. Carotid bruit is not present.  Cardiovascular: Normal rate, regular rhythm, normal heart sounds, intact distal pulses and normal pulses.   Pulmonary/Chest: No respiratory distress. He has wheezes. He has rhonchi.  Abdominal: Soft. Bowel sounds are normal. There is no tenderness. There is no guarding.  Musculoskeletal: Normal range of motion.  Lymphadenopathy:       Head (right side): No submandibular adenopathy present.       Head (left side): No submandibular adenopathy present.    He has no cervical adenopathy.  Neurological: He is alert and oriented to person, place, and time. He has normal strength. No cranial nerve deficit or sensory deficit.  Skin: Skin is warm and dry.  Psychiatric: He has a normal mood and affect. His speech is normal.    ED  Course   Procedures (including critical care time)  Labs Reviewed - No data to display No results found. No diagnosis found. PUlse ox 96% on room air. WNL by my interpretation. MDM  *I have reviewed nursing notes, vital signs, and all appropriate lab and imaging results for this patient.** Chest xray is negative for acute problem.  Vital signs stable. Pt breathing better after albuterol neb treatment. Plan - Rx for albuterol inhaler, cipro, and prednisone. Pt to see his PCP or return to the ED if not improving.  Kathie Dike, PA-C 05/10/13 1750

## 2013-05-04 NOTE — ED Notes (Signed)
Pt states productive cough, white in color x 3 days.

## 2013-05-11 NOTE — ED Provider Notes (Signed)
Medical screening examination/treatment/procedure(s) were performed by non-physician practitioner and as supervising physician I was immediately available for consultation/collaboration.   Shelda Jakes, MD 05/11/13 1728

## 2013-08-09 ENCOUNTER — Encounter (HOSPITAL_COMMUNITY): Payer: Self-pay | Admitting: Emergency Medicine

## 2013-08-09 ENCOUNTER — Emergency Department (HOSPITAL_COMMUNITY): Payer: Self-pay

## 2013-08-09 ENCOUNTER — Emergency Department (HOSPITAL_COMMUNITY)
Admission: EM | Admit: 2013-08-09 | Discharge: 2013-08-09 | Disposition: A | Discharge status: Home / Self Care | Payer: Self-pay | Attending: Emergency Medicine | Admitting: Emergency Medicine

## 2013-08-09 DIAGNOSIS — J45901 Unspecified asthma with (acute) exacerbation: Secondary | ICD-10-CM | POA: Insufficient documentation

## 2013-08-09 DIAGNOSIS — Z87891 Personal history of nicotine dependence: Secondary | ICD-10-CM | POA: Insufficient documentation

## 2013-08-09 DIAGNOSIS — IMO0002 Reserved for concepts with insufficient information to code with codable children: Secondary | ICD-10-CM | POA: Insufficient documentation

## 2013-08-09 MED ORDER — IPRATROPIUM BROMIDE 0.02 % IN SOLN
0.5000 mg | Freq: Once | RESPIRATORY_TRACT | Status: AC
  Administered 2013-08-09: 0.5 mg via RESPIRATORY_TRACT
  Filled 2013-08-09: qty 2.5

## 2013-08-09 MED ORDER — PREDNISONE 10 MG PO TABS: ORAL_TABLET | ORAL | Status: DC

## 2013-08-09 MED ORDER — PREDNISONE 50 MG PO TABS
60.0000 mg | ORAL_TABLET | Freq: Once | ORAL | Status: AC
  Administered 2013-08-09: 60 mg via ORAL
  Filled 2013-08-09 (×2): qty 1

## 2013-08-09 MED ORDER — ALBUTEROL SULFATE (5 MG/ML) 0.5% IN NEBU
5.0000 mg | INHALATION_SOLUTION | Freq: Once | RESPIRATORY_TRACT | Status: AC
  Administered 2013-08-09: 5 mg via RESPIRATORY_TRACT
  Filled 2013-08-09: qty 1

## 2013-08-09 NOTE — ED Provider Notes (Signed)
CSN: [id number]     Arrival date & time 08/09/13  0808 History   First MD Initiated Contact with Patient 08/09/13 [phone]     Chief Complaint  Patient presents with  . Cough  . Shortness of Breath   (Consider location/radiation/quality/duration/timing/severity/associated sxs/prior Treatment) HPI Comments: Robert Drake is a 27 y.o. Male with a history of asthma presenting with cough and congestion, wheezing and shortness of breath for the past 2 days.  He describes yellow sputum production which has not changed since starting mucinex DM and is using his albuterol nebs and inhaler,  Last neb tx at 6:30 this morning.  He denies fevers or chills and has had no chest pain, throat pain or nasal or sinus congestion, denies nausea, vomiting and diarrhea, no abdominal pain, no peripheral edema.  Shortness of breath is worsened with exertion.       Patient is a 27 y.o. male presenting with shortness of breath. The history is provided by the patient.  Shortness of Breath Associated symptoms: wheezing   Associated symptoms: no abdominal pain, no chest pain, no fever, no headaches, no neck pain, no rash and no sore throat     Past Medical History  Diagnosis Date  . Asthma   . Bronchitis    History reviewed. No pertinent past surgical history. History reviewed. No pertinent family history. History  Substance Use Topics  . Smoking status: Former Games developer  . Smokeless tobacco: Not on file  . Alcohol Use: No    Review of Systems  Constitutional: Negative for fever and chills.  HENT: Negative for congestion, rhinorrhea and sore throat.   Eyes: Negative.   Respiratory: Positive for shortness of breath and wheezing. Negative for chest tightness.   Cardiovascular: Negative for chest pain.  Gastrointestinal: Negative for nausea and abdominal pain.  Genitourinary: Negative.   Musculoskeletal: Negative for arthralgias, joint swelling and neck pain.  Skin: Negative.  Negative for rash and wound.   Neurological: Negative for dizziness, weakness, light-headedness, numbness and headaches.  Psychiatric/Behavioral: Negative.     Allergies  Robitussin  Home Medications   Current Outpatient Rx  Name  Route  Sig  Dispense  Refill  . ibuprofen (ADVIL,MOTRIN) 200 MG tablet   Oral   Take 400 mg by mouth every 6 (six) hours as needed for pain.         . predniSONE (DELTASONE) 10 MG tablet      6, 5, 4, 3, 2 then 1 tablet by mouth daily for 6 days total.   21 tablet   0    BP 141/86  Pulse 74  Temp(Src) 98.3 F (36.8 C) (Oral)  Resp 16  Ht 5\' 9"  (1.753 m)  Wt 213 lb (96.616 kg)  BMI 31.44 kg/m2  SpO2 95% Physical Exam  Nursing note and vitals reviewed. Constitutional: He appears well-developed and well-nourished.  HENT:  Head: Normocephalic and atraumatic.  Eyes: Conjunctivae are normal.  Neck: Normal range of motion.  Cardiovascular: Normal rate, regular rhythm, normal heart sounds and intact distal pulses.   Pulmonary/Chest: Effort normal. He has wheezes. He has no rhonchi. He has no rales.  Expiratory wheeze throughout all lung fields,  Prolonged respirations.  Abdominal: Soft. Bowel sounds are normal. There is no tenderness.  Musculoskeletal: Normal range of motion.  Neurological: He is alert.  Skin: Skin is warm and dry.  Psychiatric: He has a normal mood and affect.    ED Course  Procedures (including critical care time) Labs Review Labs Reviewed -  No data to display Imaging Review Dg Chest 2 View  08/09/2013   CLINICAL DATA:  Shortness of breath  EXAM: CHEST  2 VIEW  COMPARISON:  05/04/2013  FINDINGS: The heart size and mediastinal contours are within normal limits. Both lungs are clear. The visualized skeletal structures are unremarkable.  IMPRESSION: No active cardiopulmonary disease.   Electronically Signed   By: Salome Holmes M.D.   On: 08/09/2013 09:37    EKG Interpretation   None       MDM   1. Asthma attack    X-rays reviewed.  Patient  was reexamined after albuterol and Atrovent nebulizer and his lungs are completely clear with no wheezing, no rhonchi.  He was prescribed a prednisone taper and encouraged to continue with his nebulizer treatments every 4 hours when necessary.  The patient appears reasonably screened and/or stabilized for discharge and I doubt any other medical condition or other [facility] requiring further screening, evaluation, or treatment in the ED at this time prior to discharge.     Burgess Amor, PA-C 08/09/13 1009

## 2013-08-09 NOTE — ED Notes (Signed)
Chest congestion began Sunday night.  Cough began yesterday.  Using inhaler and nebulizer (duoneb) w/temporary relief.  Denies fever, chills, n/v/d.

## 2013-08-09 NOTE — ED Notes (Signed)
Patient with no complaints at this time. Respirations even and unlabored. Skin warm/dry. Discharge instructions reviewed with patient at this time. Patient given opportunity to voice concerns/ask questions. Patient discharged at this time and left Emergency Department with steady gait.   

## 2013-08-10 NOTE — ED Provider Notes (Signed)
Medical screening examination/treatment/procedure(s) were performed by non-physician practitioner and as supervising physician I was immediately available for consultation/collaboration.  EKG Interpretation   None         Laray Anger, DO 08/10/13 2208

## 2014-01-01 ENCOUNTER — Emergency Department (HOSPITAL_COMMUNITY)
Admission: EM | Admit: 2014-01-01 | Discharge: 2014-01-01 | Disposition: A | Discharge status: Home / Self Care | Payer: Self-pay | Attending: Emergency Medicine | Admitting: Emergency Medicine

## 2014-01-01 ENCOUNTER — Encounter (HOSPITAL_COMMUNITY): Payer: Self-pay | Admitting: Emergency Medicine

## 2014-01-01 DIAGNOSIS — Y9389 Activity, other specified: Secondary | ICD-10-CM | POA: Insufficient documentation

## 2014-01-01 DIAGNOSIS — J45909 Unspecified asthma, uncomplicated: Secondary | ICD-10-CM | POA: Insufficient documentation

## 2014-01-01 DIAGNOSIS — S61452A Open bite of left hand, initial encounter: Secondary | ICD-10-CM

## 2014-01-01 DIAGNOSIS — IMO0002 Reserved for concepts with insufficient information to code with codable children: Secondary | ICD-10-CM | POA: Insufficient documentation

## 2014-01-01 DIAGNOSIS — W540XXA Bitten by dog, initial encounter: Secondary | ICD-10-CM | POA: Insufficient documentation

## 2014-01-01 DIAGNOSIS — Z87891 Personal history of nicotine dependence: Secondary | ICD-10-CM | POA: Insufficient documentation

## 2014-01-01 DIAGNOSIS — Y92009 Unspecified place in unspecified non-institutional (private) residence as the place of occurrence of the external cause: Secondary | ICD-10-CM | POA: Insufficient documentation

## 2014-01-01 DIAGNOSIS — Z23 Encounter for immunization: Secondary | ICD-10-CM | POA: Insufficient documentation

## 2014-01-01 DIAGNOSIS — S61409A Unspecified open wound of unspecified hand, initial encounter: Secondary | ICD-10-CM | POA: Insufficient documentation

## 2014-01-01 MED ORDER — AMOXICILLIN-POT CLAVULANATE 875-125 MG PO TABS
1.0000 | ORAL_TABLET | Freq: Once | ORAL | Status: AC
  Administered 2014-01-01: 1 via ORAL
  Filled 2014-01-01: qty 1

## 2014-01-01 MED ORDER — AMOXICILLIN-POT CLAVULANATE 875-125 MG PO TABS: 1.0000 | ORAL_TABLET | Freq: Two times a day (BID) | ORAL | Status: DC

## 2014-01-01 MED ORDER — TETANUS-DIPHTH-ACELL PERTUSSIS 5-2.5-18.5 LF-MCG/0.5 IM SUSP
0.5000 mL | Freq: Once | INTRAMUSCULAR | Status: AC
  Administered 2014-01-01: 0.5 mL via INTRAMUSCULAR
  Filled 2014-01-01: qty 0.5

## 2014-01-01 NOTE — Discharge Instructions (Signed)
Animal Bite Animal bite wounds can get infected. It is important to get proper medical treatment. Ask your doctor if you need a rabies shot. HOME CARE   Follow your doctor's instructions for taking care of your wound.  Only take medicine as told by your doctor.  Take your medicine (antibiotics) as told. Finish them even if you start to feel better.  Keep all doctor visits as told. You may need a tetanus shot if:   You cannot remember when you had your last tetanus shot.  You have never had a tetanus shot.  The injury broke your skin. If you need a tetanus shot and you choose not to have one, you may get tetanus. Sickness from tetanus can be serious. GET HELP RIGHT AWAY IF:   Your wound is warm, red, sore, or puffy (swollen).  You notice yellowish-white fluid (pus) or a bad smell coming from the wound.  You see a red line on the skin coming from the wound.  You have a fever, chills, or you feel sick.  You feel sick to your stomach (nauseous), or you throw up (vomit).  Your pain does not go away, or it gets worse.  You have trouble moving the injured part.  You have weakness or numbness to your hand.  You have questions or concerns. MAKE SURE YOU:   Understand these instructions.  Will watch your condition.  Will get help right away if you are not doing well or get worse. Document Released: 09/01/2005 Document Revised: 11/24/2011 Document Reviewed: 04/23/2011 [facility] Patient Information 2014 [place], [place].

## 2014-01-01 NOTE — ED Notes (Addendum)
Pt was bit by his dog at home while trying to grab his dog from fighting with another dog. Pt was bit around 11:30 am on his left wrist. Pt has 2 puncture wounds.

## 2014-01-01 NOTE — ED Provider Notes (Signed)
CSN: [id number]     Arrival date & time 01/01/14  2002 History   First MD Initiated Contact with Patient 01/01/14 2017     Chief Complaint  Patient presents with  . Animal Bite     (Consider location/radiation/quality/duration/timing/severity/associated sxs/prior Treatment) HPI This 28 year old left-hand-dominant male was separating his own dog from another dog just prior to arrival because the dogs were fighting, the patient's own dog accidentally bit the patient on the left wrist near his hand barely breaking the skin with no pain no weakness no numbness and his dog's shots are up-to-date and his own dog is behaving normally, the patient does not know when he had his own last tetanus shot, this wound just occurred prior to arrival so he is no redness no pus drainage no decreased range of motion or other concerns at this time. Past Medical History  Diagnosis Date  . Asthma   . Bronchitis    History reviewed. No pertinent past surgical history. History reviewed. No pertinent family history. History  Substance Use Topics  . Smoking status: Former Games developermoker  . Smokeless tobacco: Not on file  . Alcohol Use: No    Review of Systems See HPI.   Allergies  Robitussin  Home Medications   Prior to Admission medications   Medication Sig Start Date End Date Taking? Authorizing Provider  amoxicillin-clavulanate (AUGMENTIN) 875-125 MG per tablet Take 1 tablet by mouth 2 (two) times daily. One po bid x 5 days 01/01/14   Hurman HornJohn M Anneli Bing, MD  ibuprofen (ADVIL,MOTRIN) 200 MG tablet Take 400 mg by mouth every 6 (six) hours as needed for pain.    Historical Provider, MD  predniSONE (DELTASONE) 10 MG tablet 6, 5, 4, 3, 2 then 1 tablet by mouth daily for 6 days total. 08/09/13   Burgess AmorJulie Idol, PA-C   BP 151/84  Pulse 102  Temp(Src) 98.2 F (36.8 C)  Resp 20  Ht 5\' 8"  (1.727 m)  Wt 234 lb (106.142 kg)  BMI 35.59 kg/m2  SpO2 99% Physical Exam  Nursing note and vitals reviewed. Constitutional:   Awake, alert, nontoxic appearance.  HENT:  Head: Atraumatic.  Eyes: Right eye exhibits no discharge. Left eye exhibits no discharge.  Neck: Neck supple.  Pulmonary/Chest: Effort normal. He exhibits no tenderness.  Abdominal: Soft. There is no tenderness. There is no rebound.  Musculoskeletal: He exhibits no tenderness.  Baseline ROM, no obvious new focal weakness. Left arm is no tenderness to the shoulder upper arm elbow or forearm, his left hand has capillary refill less than 2 seconds normal light touch intact strength and sensation in distributions of the median radial and ulnar nerve function he has 2 superficial wounds to his left wrist at the base of his hand one is a 1 cm superficial partial-thickness tear at the base of his thenar eminence the other wound is a 5 mm puncture wound just distal to the ulnar styloid with no foreign body noted no deep structure involvement noted teeth to either wound  Neurological:  Mental status and motor strength appears baseline for patient and situation.  Skin: No rash noted.  Psychiatric: He has a normal mood and affect.    ED Course  Procedures (including critical care time) Patient informed of clinical course, understand medical decision-making process, and agree with plan. Labs Review Labs Reviewed - No data to display  Imaging Review No results found.   EKG Interpretation None      MDM   Final diagnoses:  Dog bite  of left hand    I doubt any other EMC precluding discharge at this time including, but not necessarily limited to the following:septic joint, tenosynovitis.    Hurman HornJohn M Sterlin Knightly, MD 01/02/14 2159

## 2014-02-19 ENCOUNTER — Emergency Department (HOSPITAL_COMMUNITY): Payer: Self-pay

## 2014-02-19 ENCOUNTER — Encounter (HOSPITAL_COMMUNITY): Payer: Self-pay | Admitting: Emergency Medicine

## 2014-02-19 DIAGNOSIS — Z791 Long term (current) use of non-steroidal anti-inflammatories (NSAID): Secondary | ICD-10-CM | POA: Insufficient documentation

## 2014-02-19 DIAGNOSIS — J45901 Unspecified asthma with (acute) exacerbation: Secondary | ICD-10-CM | POA: Insufficient documentation

## 2014-02-19 DIAGNOSIS — Z79899 Other long term (current) drug therapy: Secondary | ICD-10-CM | POA: Insufficient documentation

## 2014-02-19 DIAGNOSIS — Z792 Long term (current) use of antibiotics: Secondary | ICD-10-CM | POA: Insufficient documentation

## 2014-02-19 DIAGNOSIS — I10 Essential (primary) hypertension: Secondary | ICD-10-CM | POA: Insufficient documentation

## 2014-02-19 DIAGNOSIS — Z87891 Personal history of nicotine dependence: Secondary | ICD-10-CM | POA: Insufficient documentation

## 2014-02-19 NOTE — ED Notes (Signed)
Patient c/o productive cough since Friday; denies fevers.

## 2014-02-20 ENCOUNTER — Emergency Department (HOSPITAL_COMMUNITY)
Admission: EM | Admit: 2014-02-20 | Discharge: 2014-02-20 | Disposition: A | Discharge status: Home / Self Care | Payer: Self-pay | Attending: Emergency Medicine | Admitting: Emergency Medicine

## 2014-02-20 DIAGNOSIS — J45901 Unspecified asthma with (acute) exacerbation: Secondary | ICD-10-CM

## 2014-02-20 MED ORDER — IPRATROPIUM-ALBUTEROL 0.5-2.5 (3) MG/3ML IN SOLN
3.0000 mL | Freq: Once | RESPIRATORY_TRACT | Status: AC
  Administered 2014-02-20: 3 mL via RESPIRATORY_TRACT
  Filled 2014-02-20: qty 3

## 2014-02-20 MED ORDER — PREDNISONE 20 MG PO TABS: ORAL_TABLET | ORAL | Status: DC

## 2014-02-20 MED ORDER — PREDNISONE 50 MG PO TABS
60.0000 mg | ORAL_TABLET | Freq: Once | ORAL | Status: AC
  Administered 2014-02-20: 60 mg via ORAL
  Filled 2014-02-20 (×2): qty 1

## 2014-02-20 NOTE — Discharge Instructions (Signed)
Asthma, Adult Asthma is a recurring condition in which the airways tighten and narrow. Asthma can make it difficult to breathe. It can cause coughing, wheezing, and shortness of breath. Asthma episodes (also called asthma attacks) range from minor to life-threatening. Asthma cannot be cured, but medicines and lifestyle changes can help control it. CAUSES Asthma is believed to be caused by inherited (genetic) and environmental factors, but its exact cause is unknown. Asthma may be triggered by allergens, lung infections, or irritants in the air. Asthma triggers are different for each person. Common triggers include:   Animal dander.  Dust mites.  Cockroaches.  Pollen from trees or grass.  Mold.  Smoke.  Air pollutants such as dust, household cleaners, hair sprays, aerosol sprays, paint fumes, strong chemicals, or strong odors.  Cold air, weather changes, and winds (which increase molds and pollens in the air).  Strong emotional expressions such as crying or laughing hard.  Stress.  Certain medicines (such as aspirin) or types of drugs (such as beta-blockers).  Sulfites in foods and drinks. Foods and drinks that may contain sulfites include dried fruit, potato chips, and sparkling grape juice.  Infections or inflammatory conditions such as the flu, a cold, or an inflammation of the nasal membranes (rhinitis).  Gastroesophageal reflux disease (GERD).  Exercise or strenuous activity. SYMPTOMS Symptoms may occur immediately after asthma is triggered or many hours later. Symptoms include:  Wheezing.  Excessive nighttime or early morning coughing.  Frequent or severe coughing with a common cold.  Chest tightness.  Shortness of breath. DIAGNOSIS  The diagnosis of asthma is made by a review of your medical history and a physical exam. Tests may also be performed. These may include:  Lung function studies. These tests show how much air you breath in and out.  Allergy  tests.  Imaging tests such as X-rays. TREATMENT  Asthma cannot be cured, but it can usually be controlled. Treatment involves identifying and avoiding your asthma triggers. It also involves medicines. There are 2 classes of medicine used for asthma treatment:   Controller medicines. These prevent asthma symptoms from occurring. They are usually taken every day.  Reliever or rescue medicines. These quickly relieve asthma symptoms. They are used as needed and provide short-term relief. Your health care provider will help you create an asthma action plan. An asthma action plan is a written plan for managing and treating your asthma attacks. It includes a list of your asthma triggers and how they may be avoided. It also includes information on when medicines should be taken and when their dosage should be changed. An action plan may also involve the use of a device called a peak flow meter. A peak flow meter measures how well the lungs are working. It helps you monitor your condition. HOME CARE INSTRUCTIONS   Take medicine as directed by your health care provider. Speak with your health care provider if you have questions about how or when to take the medicines.  Use a peak flow meter as directed by your health care provider. Record and keep track of readings.  Understand and use the action plan to help minimize or stop an asthma attack without needing to seek medical care.  Control your home environment in the following ways to help prevent asthma attacks:  Do not smoke. Avoid being exposed to secondhand smoke.  Change your heating and air conditioning filter regularly.  Limit your use of fireplaces and wood stoves.  Get rid of pests (such as roaches and   mice) and their droppings.  Throw away plants if you see mold on them.  Clean your floors and dust regularly. Use unscented cleaning products.  Try to have someone else vacuum for you regularly. Stay out of rooms while they are being  vacuumed and for a short while afterward. If you vacuum, use a dust mask from a hardware store, a double-layered or microfilter vacuum cleaner bag, or a vacuum cleaner with a HEPA filter.  Replace carpet with wood, tile, or vinyl flooring. Carpet can trap dander and dust.  Use allergy-proof pillows, mattress covers, and box spring covers.  Wash bed sheets and blankets every week in hot water and dry them in a dryer.  Use blankets that are made of polyester or cotton.  Clean bathrooms and kitchens with bleach. If possible, have someone repaint the walls in these rooms with mold-resistant paint. Keep out of the rooms that are being cleaned and painted.  Wash hands frequently. SEEK MEDICAL CARE IF:   You have wheezing, shortness of breath, or a cough even if taking medicine to prevent attacks.  The colored mucus you cough up (sputum) is thicker than usual.  Your sputum changes from clear or white to yellow, green, gray, or bloody.  You have any problems that may be related to the medicines you are taking (such as a rash, itching, swelling, or trouble breathing).  You are using a reliever medicine more than 2 3 times per week.  Your peak flow is still at 50 79% of you personal best after following your action plan for 1 hour. SEEK IMMEDIATE MEDICAL CARE IF:   You seem to be getting worse and are unresponsive to treatment during an asthma attack.  You are short of breath even at rest.  You get short of breath when doing very little physical activity.  You have difficulty eating, drinking, or talking due to asthma symptoms.  You develop chest pain.  You develop a fast heartbeat.  You have a bluish color to your lips or fingernails.  You are lightheaded, dizzy, or faint.  Your peak flow is less than 50% of your personal best.  You have a fever or persistent symptoms for more than 2 3 days.  You have a fever and symptoms suddenly get worse. MAKE SURE YOU:   Understand these  instructions.  Will watch your condition.  Will get help right away if you are not doing well or get worse. Document Released: 09/01/2005 Document Revised: 05/04/2013 Document Reviewed: 03/31/2013 [employer] Patient Information 2014 [employer].  

## 2014-02-20 NOTE — ED Provider Notes (Signed)
CSN: [id number]     Arrival date & time 02/19/14  2244 History  This chart was scribed for Robert Skeens, MD by Chestine Spore, ED Scribe. The patient was seen in room [facility]12/[facility] at 12:39 AM.     Chief Complaint  Patient presents with  . Bronchitis    The history is provided by the patient. No language interpreter was used.    HPI Comments: Robert Drake is a 28 y.o. male who presents to the Emergency Department complaining of a cough onset about 2 days ago. Pt states that he has an associated symptoms of SOB. Pt denies fevers, and chills. Pt states that he uses his Albuterol nebulizer and inhaler with only temporary relief. Pt has a h/o bronchitis and asthma and states that his current symptoms are similar. Pt also has a h/o HTN, but he denies h/o DM or heart problems. Pt denies DVT, leg swelling, recent surgery, or hemoptysis.  Pt has not been taking Prednisone recently.    Past Medical History  Diagnosis Date  . Asthma   . Bronchitis     History reviewed. No pertinent past surgical history.  No family history on file.   History  Substance Use Topics  . Smoking status: Former Games developer  . Smokeless tobacco: Not on file  . Alcohol Use: No     Review of Systems  Constitutional: Negative for fever and chills.  Respiratory: Positive for shortness of breath.   Cardiovascular: Negative for leg swelling.  All other systems reviewed and are negative.     Allergies  Robitussin  Home Medications   Prior to Admission medications   Medication Sig Start Date End Date Taking? Authorizing Provider  albuterol (PROVENTIL HFA;VENTOLIN HFA) 108 (90 BASE) MCG/ACT inhaler Inhale 2 puffs into the lungs every 4 (four) hours as needed for wheezing or shortness of breath.   Yes Historical Provider, MD  albuterol (PROVENTIL) (2.5 MG/3ML) 0.083% nebulizer solution Take 2.5 mg by nebulization every 6 (six) hours as needed for wheezing or shortness of breath.   Yes Historical Provider, MD   amoxicillin-clavulanate (AUGMENTIN) 875-125 MG per tablet Take 1 tablet by mouth 2 (two) times daily. One po bid x 5 days 01/01/14   Hurman Horn, MD  ibuprofen (ADVIL,MOTRIN) 200 MG tablet Take 400 mg by mouth every 6 (six) hours as needed for pain.    Historical Provider, MD  miconazole (NEOSPORIN AF) 2 % cream Apply 1 application topically as needed (for symptoms).    Historical Provider, MD   BP 152/80  Pulse 91  Temp(Src) 98 F (36.7 C) (Oral)  Resp 18  Ht 5\' 9"  (1.753 m)  Wt 235 lb (106.595 kg)  BMI 34.69 kg/m2  SpO2 96%  Physical Exam  Nursing note and vitals reviewed. Constitutional: He is oriented to person, place, and time. He appears well-developed and well-nourished. No distress.  HENT:  Head: Normocephalic and atraumatic.  Mouth/Throat: Mucous membranes are normal.  Eyes: EOM are normal.  Pupils equal bilaterally.   Neck: Neck supple. No tracheal deviation present.  Cardiovascular: Normal rate and regular rhythm.   Pulmonary/Chest: Effort normal. No respiratory distress. He has wheezes (expiratory and bilaterally).  Coughing in room.  Musculoskeletal: Normal range of motion.  Neurological: He is alert and oriented to person, place, and time.  Skin: Skin is warm and dry.  Psychiatric: He has a normal mood and affect. His behavior is normal.    ED Course  Procedures (including critical care time)  Medications  predniSONE (DELTASONE) tablet 60 mg (60 mg Oral Given 02/20/14 0109)  ipratropium-albuterol (DUONEB) 0.5-2.5 (3) MG/3ML nebulizer solution 3 mL (3 mLs Nebulization Given 02/20/14 0010)    DIAGNOSTIC STUDIES: Oxygen Saturation is 96% on room air, normal by my interpretation.    COORDINATION OF CARE: 12:41 AM-Discussed treatment plan which includes CXR with pt at bedside and pt agreed to plan.   Dg Chest 2 View  02/20/2014   CLINICAL DATA:  Cough and congestion.  History of asthma.  EXAM: CHEST  2 VIEW  COMPARISON:  08/09/2013.  FINDINGS: The heart size and  mediastinal contours are within normal limits. Both lungs are clear. The visualized skeletal structures are unremarkable.  IMPRESSION: No active cardiopulmonary disease.   Electronically Signed   By: Davonna BellingJohn  Curnes M.D.   On: 02/20/2014 00:21     EKG Interpretation None      MDM   Final diagnoses:  Acute asthma exacerbation   I personally performed the services described in this documentation, which was scribed in my presence. The recorded information has been reviewed and is accurate.  Overall well-appearing male with bronchitis/asthma symptoms similar previous. Patient not requiring oxygen in ER and no significant respiratory effort.  Results and differential diagnosis were discussed with the patient/parent/guardian. Close follow up outpatient was discussed, comfortable with the plan.   Medications  predniSONE (DELTASONE) tablet 60 mg (60 mg Oral Given 02/20/14 0109)  ipratropium-albuterol (DUONEB) 0.5-2.5 (3) MG/3ML nebulizer solution 3 mL (3 mLs Nebulization Given 02/20/14 0010)    Filed Vitals:   02/19/14 2304 02/20/14 0113  BP: 152/80   Pulse: 91   Temp: 98 F (36.7 C)   TempSrc: Oral   Resp: 18   Height: 5\' 9"  (1.753 m)   Weight: 235 lb (106.595 kg)   SpO2: 96% 99%        Robert SkeensJoshua M Candy Leverett, MD 02/20/14 [id number]

## 2014-09-05 ENCOUNTER — Emergency Department (HOSPITAL_COMMUNITY)
Admission: EM | Admit: 2014-09-05 | Discharge: 2014-09-05 | Disposition: A | Discharge status: Home / Self Care | Payer: Self-pay | Attending: Emergency Medicine | Admitting: Emergency Medicine

## 2014-09-05 ENCOUNTER — Encounter (HOSPITAL_COMMUNITY): Payer: Self-pay | Admitting: Emergency Medicine

## 2014-09-05 ENCOUNTER — Emergency Department (HOSPITAL_COMMUNITY): Payer: Self-pay

## 2014-09-05 DIAGNOSIS — J45901 Unspecified asthma with (acute) exacerbation: Secondary | ICD-10-CM | POA: Insufficient documentation

## 2014-09-05 DIAGNOSIS — Z7952 Long term (current) use of systemic steroids: Secondary | ICD-10-CM | POA: Insufficient documentation

## 2014-09-05 DIAGNOSIS — Z792 Long term (current) use of antibiotics: Secondary | ICD-10-CM | POA: Insufficient documentation

## 2014-09-05 DIAGNOSIS — Z79899 Other long term (current) drug therapy: Secondary | ICD-10-CM | POA: Insufficient documentation

## 2014-09-05 DIAGNOSIS — I10 Essential (primary) hypertension: Secondary | ICD-10-CM | POA: Insufficient documentation

## 2014-09-05 DIAGNOSIS — R0602 Shortness of breath: Secondary | ICD-10-CM

## 2014-09-05 DIAGNOSIS — Z87891 Personal history of nicotine dependence: Secondary | ICD-10-CM | POA: Insufficient documentation

## 2014-09-05 HISTORY — DX: Essential (primary) hypertension: I10

## 2014-09-05 MED ORDER — ALBUTEROL SULFATE HFA 108 (90 BASE) MCG/ACT IN AERS: 2.0000 | INHALATION_SPRAY | RESPIRATORY_TRACT | Status: DC | PRN

## 2014-09-05 MED ORDER — PREDNISONE 20 MG PO TABS
ORAL_TABLET | ORAL | Status: DC
Start: 2014-09-05 — End: 2015-06-20

## 2014-09-05 MED ORDER — PREDNISONE 50 MG PO TABS
60.0000 mg | ORAL_TABLET | Freq: Once | ORAL | Status: AC
  Administered 2014-09-05: 60 mg via ORAL
  Filled 2014-09-05 (×2): qty 1

## 2014-09-05 MED ORDER — ALBUTEROL (5 MG/ML) CONTINUOUS INHALATION SOLN
15.0000 mg/h | INHALATION_SOLUTION | Freq: Once | RESPIRATORY_TRACT | Status: AC
  Administered 2014-09-05: 15 mg/h via RESPIRATORY_TRACT
  Filled 2014-09-05: qty 20

## 2014-09-05 MED ORDER — IPRATROPIUM BROMIDE 0.02 % IN SOLN
0.5000 mg | Freq: Once | RESPIRATORY_TRACT | Status: AC
  Administered 2014-09-05: 0.5 mg via RESPIRATORY_TRACT
  Filled 2014-09-05: qty 2.5

## 2014-09-05 NOTE — ED Notes (Signed)
Pt left ED, ambulatory with no signs of distress. Pt verbalizes discharge instructions. 

## 2014-09-05 NOTE — Discharge Instructions (Signed)
Drink plenty of fluids. Use your inhaler and nebulizer as needed. Take the prednisone until gone.  Return if you feel worse again.    Asthma Attack Prevention Although there is no way to prevent asthma from starting, you can take steps to control the disease and reduce its symptoms. Learn about your asthma and how to control it. Take an active role to control your asthma by working with your health care provider to create and follow an asthma action plan. An asthma action plan guides you in:  Taking your medicines properly.  Avoiding things that set off your asthma or make your asthma worse (asthma triggers).  Tracking your level of asthma control.  Responding to worsening asthma.  Seeking emergency care when needed. To track your asthma, keep records of your symptoms, check your peak flow number using a handheld device that shows how well air moves out of your lungs (peak flow meter), and get regular asthma checkups.  WHAT ARE SOME WAYS TO PREVENT AN ASTHMA ATTACK?  Take medicines as directed by your health care provider.  Keep track of your asthma symptoms and level of control.  With your health care provider, write a detailed plan for taking medicines and managing an asthma attack. Then be sure to follow your action plan. Asthma is an ongoing condition that needs regular monitoring and treatment.  Identify and avoid asthma triggers. Many outdoor allergens and irritants (such as pollen, mold, cold air, and air pollution) can trigger asthma attacks. Find out what your asthma triggers are and take steps to avoid them.  Monitor your breathing. Learn to recognize warning signs of an attack, such as coughing, wheezing, or shortness of breath. Your lung function may decrease before you notice any signs or symptoms, so regularly measure and record your peak airflow with a home peak flow meter.  Identify and treat attacks early. If you act quickly, you are less likely to have a severe attack.  You will also need less medicine to control your symptoms. When your peak flow measurements decrease and alert you to an upcoming attack, take your medicine as instructed and immediately stop any activity that may have triggered the attack. If your symptoms do not improve, get medical help.  Pay attention to increasing quick-relief inhaler use. If you find yourself relying on your quick-relief inhaler, your asthma is not under control. See your health care provider about adjusting your treatment. WHAT CAN MAKE MY SYMPTOMS WORSE? A number of common things can set off or make your asthma symptoms worse and cause temporary increased inflammation of your airways. Keep track of your asthma symptoms for several weeks, detailing all the environmental and emotional factors that are linked with your asthma. When you have an asthma attack, go back to your asthma diary to see which factor, or combination of factors, might have contributed to it. Once you know what these factors are, you can take steps to control many of them. If you have allergies and asthma, it is important to take asthma prevention steps at home. Minimizing contact with the substance to which you are allergic will help prevent an asthma attack. Some triggers and ways to avoid these triggers are: Animal Dander:  Some people are allergic to the flakes of skin or dried saliva from animals with fur or feathers.   There is no such thing as a hypoallergenic dog or cat breed. All dogs or cats can cause allergies, even if they don't shed.  Keep these pets out of  your home.  If you are not able to keep a pet outdoors, keep the pet out of your bedroom and other sleeping areas at all times, and keep the door closed.  Remove carpets and furniture covered with cloth from your home. If that is not possible, keep the pet away from fabric-covered furniture and carpets. Dust Mites: Many people with asthma are allergic to dust mites. Dust mites are tiny bugs  that are found in every home in mattresses, pillows, carpets, fabric-covered furniture, bedcovers, clothes, stuffed toys, and other fabric-covered items.   Cover your mattress in a special dust-proof cover.  Cover your pillow in a special dust-proof cover, or wash the pillow each week in hot water. Water must be hotter than 130 F (54.4 C) to kill dust mites. Cold or warm water used with detergent and bleach can also be effective.  Wash the sheets and blankets on your bed each week in hot water.  Try not to sleep or lie on cloth-covered cushions.  Call ahead when traveling and ask for a smoke-free hotel room. Bring your own bedding and pillows in case the hotel only supplies feather pillows and down comforters, which may contain dust mites and cause asthma symptoms.  Remove carpets from your bedroom and those laid on concrete, if you can.  Keep stuffed toys out of the bed, or wash the toys weekly in hot water or cooler water with detergent and bleach. Cockroaches: Many people with asthma are allergic to the droppings and remains of cockroaches.   Keep food and garbage in closed containers. Never leave food out.  Use poison baits, traps, powders, gels, or paste (for example, boric acid).  If a spray is used to kill cockroaches, stay out of the room until the odor goes away. Indoor Mold:  Fix leaky faucets, pipes, or other sources of water that have mold around them.  Clean floors and moldy surfaces with a fungicide or diluted bleach.  Avoid using humidifiers, vaporizers, or swamp coolers. These can spread molds through the air. Pollen and Outdoor Mold:  When pollen or mold spore counts are high, try to keep your windows closed.  Stay indoors with windows closed from late morning to afternoon. Pollen and some mold spore counts are highest at that time.  Ask your health care provider whether you need to take anti-inflammatory medicine or increase your dose of the medicine before  your allergy season starts. Other Irritants to Avoid:  Tobacco smoke is an irritant. If you smoke, ask your health care provider how you can quit. Ask family members to quit smoking, too. Do not allow smoking in your home or car.  If possible, do not use a wood-burning stove, kerosene heater, or fireplace. Minimize exposure to all sources of smoke, including incense, candles, fires, and fireworks.  Try to stay away from strong odors and sprays, such as perfume, talcum powder, hair spray, and paints.  Decrease humidity in your home and use an indoor air cleaning device. Reduce indoor humidity to below 60%. Dehumidifiers or central air conditioners can do this.  Decrease house dust exposure by changing furnace and air cooler filters frequently.  Try to have someone else vacuum for you once or twice a week. Stay out of rooms while they are being vacuumed and for a short while afterward.  If you vacuum, use a dust mask from a hardware store, a double-layered or microfilter vacuum cleaner bag, or a vacuum cleaner with a HEPA filter.  Sulfites in foods  and beverages can be irritants. Do not drink beer or wine or eat dried fruit, processed potatoes, or shrimp if they cause asthma symptoms.  Cold air can trigger an asthma attack. Cover your nose and mouth with a scarf on cold or windy days.  Several health conditions can make asthma more difficult to manage, including a runny nose, sinus infections, reflux disease, psychological stress, and sleep apnea. Work with your health care provider to manage these conditions.  Avoid close contact with people who have a respiratory infection such as a cold or the flu, since your asthma symptoms may get worse if you catch the infection. Wash your hands thoroughly after touching items that may have been handled by people with a respiratory infection.  Get a flu shot every year to protect against the flu virus, which often makes asthma worse for days or weeks.  Also get a pneumonia shot if you have not previously had one. Unlike the flu shot, the pneumonia shot does not need to be given yearly. Medicines:  Talk to your health care provider about whether it is safe for you to take aspirin or non-steroidal anti-inflammatory medicines (NSAIDs). In a small number of people with asthma, aspirin and NSAIDs can cause asthma attacks. These medicines must be avoided by people who have known aspirin-sensitive asthma. It is important that people with aspirin-sensitive asthma read labels of all over-the-counter medicines used to treat pain, colds, coughs, and fever.  Beta-blockers and ACE inhibitors are other medicines you should discuss with your health care provider. HOW CAN I FIND OUT WHAT I AM ALLERGIC TO? Ask your asthma health care provider about allergy skin testing or blood testing (the RAST test) to identify the allergens to which you are sensitive. If you are found to have allergies, the most important thing to do is to try to avoid exposure to any allergens that you are sensitive to as much as possible. Other treatments for allergies, such as medicines and allergy shots (immunotherapy) are available.  CAN I EXERCISE? Follow your health care provider's advice regarding asthma treatment before exercising. It is important to maintain a regular exercise program, but vigorous exercise or exercise in cold, humid, or dry environments can cause asthma attacks, especially for those people who have exercise-induced asthma. Document Released: 08/20/2009 Document Revised: 09/06/2013 Document Reviewed: 03/09/2013 [facility] Patient Information 2015 [place], [place]. This information is not intended to replace advice given to you by your health care provider. Make sure you discuss any questions you have with your health care provider.

## 2014-09-05 NOTE — ED Notes (Signed)
Patient ambulated in ED with portable pulse ox. Saturation between 92 to 97 percent. Patient states that he  Is not short of breath or not having any pain at this time.

## 2014-09-05 NOTE — ED Provider Notes (Signed)
CSN: [id number]     Arrival date & time 09/05/14  0036 History   First MD Initiated Contact with Patient 09/05/14 0116     Chief Complaint  Patient presents with  . Cough  . Shortness of Breath     (Consider location/radiation/quality/duration/timing/severity/associated sxs/prior Treatment) HPI  Patient has a history of reactive airway disease and uses inhalers and nebulizers at home. He reports on December 19 he started getting worsening shortness of breath and wheezing. He states he is using his nebulizer about 8 times a day instead of the usual four and in  addition to using his inhaler. He states if he walks up stairs or exerts himself such as walking around in a store he gets very short of breath. He has had a cough with some white sputum production, he denies fever but did have some mild chills this morning. He denies sore throat, rhinorrhea, nausea, vomiting, or diarrhea.  [facility]   Past Medical History  Diagnosis Date  . Asthma   . Bronchitis   . Hypertension    History reviewed. No pertinent past surgical history. No family history on file. History  Substance Use Topics  . Smoking status: Former Games developermoker  . Smokeless tobacco: Not on file  . Alcohol Use: No  unempoloyed +THC  Review of Systems  All other systems reviewed and are negative.     Allergies  Robitussin  Home Medications   Prior to Admission medications   Medication Sig Start Date End Date Taking? Authorizing Provider  albuterol (PROVENTIL HFA;VENTOLIN HFA) 108 (90 BASE) MCG/ACT inhaler Inhale 2 puffs into the lungs every 4 (four) hours as needed for wheezing or shortness of breath.    Historical Provider, MD  albuterol (PROVENTIL) (2.5 MG/3ML) 0.083% nebulizer solution Take 2.5 mg by nebulization every 6 (six) hours as needed for wheezing or shortness of breath.    Historical Provider, MD  amoxicillin-clavulanate (AUGMENTIN) 875-125 MG per tablet Take 1 tablet by mouth  2 (two) times daily. One po bid x 5 days 01/01/14   Hurman HornJohn M Bednar, MD  ibuprofen (ADVIL,MOTRIN) 200 MG tablet Take 400 mg by mouth every 6 (six) hours as needed for pain.    Historical Provider, MD  miconazole (NEOSPORIN AF) 2 % cream Apply 1 application topically as needed (for symptoms).    Historical Provider, MD  predniSONE (DELTASONE) 20 MG tablet 2 tabs po daily x 4 days 02/20/14   Enid SkeensJoshua M Zavitz, MD   BP 139/83 mmHg  Temp(Src) 98.5 F (36.9 C) (Oral)  Resp 24  Ht 5\' 8"  (1.727 m)  Wt 218 lb (98.884 kg)  BMI 33.15 kg/m2  SpO2 97%  Vital signs normal   Physical Exam  Constitutional: He is oriented to person, place, and time. He appears well-developed and well-nourished.  Non-toxic appearance. He does not appear ill. No distress.  HENT:  Head: Normocephalic and atraumatic.  Right Ear: External ear normal.  Left Ear: External ear normal.  Nose: Nose normal. No mucosal edema or rhinorrhea.  Mouth/Throat: Oropharynx is clear and moist and mucous membranes are normal. No dental abscesses or uvula swelling.  Eyes: Conjunctivae and EOM are normal. Pupils are equal, round, and reactive to light.  Neck: Normal range of motion and full passive range of motion without pain. Neck supple.  Cardiovascular: Normal rate, regular rhythm and normal heart sounds.  Exam reveals no gallop and no friction rub.   No murmur heard. Pulmonary/Chest: Effort normal. No respiratory distress. He  has decreased breath sounds. He has wheezes. He has no rhonchi. He has no rales. He exhibits no tenderness and no crepitus.  Coughing at times  Abdominal: Soft. Normal appearance and bowel sounds are normal. He exhibits no distension. There is no tenderness. There is no rebound and no guarding.  Musculoskeletal: Normal range of motion. He exhibits no edema or tenderness.  Moves all extremities well.   Neurological: He is alert and oriented to person, place, and time. He has normal strength. No cranial nerve deficit.   Skin: Skin is warm, dry and intact. No rash noted. No erythema. No pallor.  Psychiatric: He has a normal mood and affect. His speech is normal and behavior is normal. His mood appears not anxious.  Nursing note and vitals reviewed.   ED Course  Procedures (including critical care time)  Medications  albuterol (PROVENTIL,VENTOLIN) solution continuous neb (not administered)  ipratropium (ATROVENT) nebulizer solution 0.5 mg (not administered)  predniSONE (DELTASONE) tablet 60 mg (not administered)   Recheck at 03 20  patient has been on the nebulizer about hour, he still has about half of the liquid in his chamber. He states he starting to feel better. On exam he has much improved air movement and now actually has more wheezing that he did before. I will reassess him after he finishes his nebulizer.  Recheck 04:30 patient has improved air movement. He has rare rhonchi at the bases. He states he feels much improved. He states his chest tightness is gone. Patient was ambulated by nursing staff at his pulse ox was 92-97% on room air without making him feel short of breath. Patient is being discharged this time.    Labs Review Labs Reviewed - No data to display  Imaging Review Dg Chest 2 View  09/05/2014   CLINICAL DATA:  Acute onset of productive cough. Shortness of breath with exertion. Personal history of smoking and current history of moderate asthma. Initial encounter.  EXAM: CHEST  2 VIEW  COMPARISON:  Chest radiograph performed 02/19/2014  FINDINGS: The lungs are well-aerated and clear. There is no evidence of focal opacification, pleural effusion or pneumothorax.  The heart is normal in size; the mediastinal contour is within normal limits. No acute osseous abnormalities are seen.  IMPRESSION: No acute cardiopulmonary process seen.   Electronically Signed   By: Roanna RaiderJeffery  Chang M.D.   On: 09/05/2014 01:17     EKG Interpretation None      MDM   Final diagnoses:  SOB (shortness of  breath) on exertion    New Prescriptions   ALBUTEROL (PROVENTIL HFA;VENTOLIN HFA) 108 (90 BASE) MCG/ACT INHALER    Inhale 2 puffs into the lungs every 4 (four) hours as needed.   PREDNISONE (DELTASONE) 20 MG TABLET    Take 3 po QD x 3d , then 2 po QD x 3d then 1 po QD x 3d    Plan discharge  Devoria AlbeIva Avila Albritton, MD, Franz DellFACEP     Sai Moura L Kory Panjwani, MD 09/05/14 [phone]

## 2014-09-05 NOTE — ED Notes (Signed)
Pt c/o cough and dyspnea with exertion since Friday. States "I think it's my bronchitis. It comes with the weather."

## 2015-06-20 ENCOUNTER — Encounter (HOSPITAL_COMMUNITY): Payer: Self-pay | Admitting: Emergency Medicine

## 2015-06-20 ENCOUNTER — Emergency Department (HOSPITAL_COMMUNITY)
Admission: EM | Admit: 2015-06-20 | Discharge: 2015-06-20 | Disposition: A | Discharge status: Home / Self Care | Payer: Self-pay | Attending: Emergency Medicine | Admitting: Emergency Medicine

## 2015-06-20 ENCOUNTER — Emergency Department (HOSPITAL_COMMUNITY): Payer: Self-pay

## 2015-06-20 DIAGNOSIS — J4521 Mild intermittent asthma with (acute) exacerbation: Secondary | ICD-10-CM | POA: Insufficient documentation

## 2015-06-20 DIAGNOSIS — I10 Essential (primary) hypertension: Secondary | ICD-10-CM | POA: Insufficient documentation

## 2015-06-20 DIAGNOSIS — Z87891 Personal history of nicotine dependence: Secondary | ICD-10-CM | POA: Insufficient documentation

## 2015-06-20 DIAGNOSIS — Z79899 Other long term (current) drug therapy: Secondary | ICD-10-CM | POA: Insufficient documentation

## 2015-06-20 MED ORDER — IPRATROPIUM-ALBUTEROL 0.5-2.5 (3) MG/3ML IN SOLN
3.0000 mL | Freq: Once | RESPIRATORY_TRACT | Status: AC
  Administered 2015-06-20: 3 mL via RESPIRATORY_TRACT
  Filled 2015-06-20: qty 3

## 2015-06-20 MED ORDER — HYDROCODONE-ACETAMINOPHEN 5-325 MG PO TABS: ORAL_TABLET | ORAL | Status: DC

## 2015-06-20 MED ORDER — PREDNISONE 20 MG PO TABS: ORAL_TABLET | ORAL | Status: DC

## 2015-06-20 MED ORDER — HYDROCODONE-ACETAMINOPHEN 5-325 MG PO TABS
1.0000 | ORAL_TABLET | Freq: Once | ORAL | Status: AC
  Administered 2015-06-20: 1 via ORAL
  Filled 2015-06-20: qty 1

## 2015-06-20 MED ORDER — PREDNISONE 50 MG PO TABS
60.0000 mg | ORAL_TABLET | Freq: Once | ORAL | Status: AC
  Administered 2015-06-20: 19:00:00 60 mg via ORAL
  Filled 2015-06-20 (×2): qty 1

## 2015-06-20 NOTE — ED Notes (Signed)
Respiratory in room for neb treatment

## 2015-06-20 NOTE — ED Notes (Signed)
Pt verbalized understanding of no driving and to use caution within 4 hours of taking pain meds due to meds cause drowsiness 

## 2015-06-20 NOTE — ED Notes (Signed)
Patient complaining of headache and shortness of breath x 4 days. States breathing treatments at home are not effective.

## 2015-06-20 NOTE — Discharge Instructions (Signed)
Continue to use your medications at home as directed. Follow up with your doctor or return here as needed for worsening symptoms.  Do not take the narcotic that I am giving you for cough if you are driving or at work as it will make you sleepy.

## 2015-06-20 NOTE — ED Provider Notes (Signed)
CSN: [id number]     Arrival date & time 06/20/15  1747 History   First MD Initiated Contact with Patient 06/20/15 1849     Chief Complaint  Patient presents with  . Headache  . Shortness of Breath     (Consider location/radiation/quality/duration/timing/severity/associated sxs/prior Treatment) Patient is a 29 y.o. male presenting with shortness of breath and cough. The history is provided by the patient.  Shortness of Breath Associated symptoms: cough, headaches and sore throat (mild with cough)   Associated symptoms: no fever   Cough Severity:  Moderate Onset quality:  Gradual Duration:  4 days Timing:  Intermittent Progression:  Worsening Chronicity:  New Smoker: former.   Context: weather changes   Relieved by:  Nothing Ineffective treatments:  Beta-agonist inhaler and home nebulizer Associated symptoms: chills, headaches, shortness of breath and sore throat (mild with cough)   Associated symptoms: no fever    Robert Drake is a 29 y.o. male with a hx of asthma who presents to the ED with cough and wheezing and feeling short of breath that started 4 days ago. He has been using his home neb treatment without relief. Today he has a headache and symptoms seem worse.   Past Medical History  Diagnosis Date  . Asthma   . Bronchitis   . Hypertension    History reviewed. No pertinent past surgical history. History reviewed. No pertinent family history. Social History  Substance Use Topics  . Smoking status: Former Games developer  . Smokeless tobacco: None  . Alcohol Use: No    Review of Systems  Constitutional: Positive for chills. Negative for fever.  HENT: Positive for sore throat (mild with cough).   Respiratory: Positive for cough and shortness of breath.   Neurological: Positive for headaches.  all other systems negative    Allergies  Robitussin  Home Medications   Prior to Admission medications   Medication Sig Start Date End Date Taking? Authorizing Provider   albuterol (PROVENTIL HFA;VENTOLIN HFA) 108 (90 BASE) MCG/ACT inhaler Inhale 2 puffs into the lungs every 4 (four) hours as needed. 09/05/14   Devoria Albe, MD  amoxicillin-clavulanate (AUGMENTIN) 875-125 MG per tablet Take 1 tablet by mouth 2 (two) times daily. One po bid x 5 days 01/01/14   Wayland Salinas, MD  HYDROcodone-acetaminophen (NORCO/VICODIN) 5-325 MG tablet As needed for cough 06/20/15   [facility], NP  ibuprofen (ADVIL,MOTRIN) 200 MG tablet Take 400 mg by mouth every 6 (six) hours as needed for pain.    Historical Provider, MD  miconazole (NEOSPORIN AF) 2 % cream Apply 1 application topically as needed (for symptoms).    Historical Provider, MD  predniSONE (DELTASONE) 20 MG tablet Take 3 tablets PO QD x 3 days then 2 po x3 days, then 1 PO x 3 days 06/20/15   Janne Napoleon, NP   BP 133/82 mmHg  Pulse 89  Temp(Src) 98.4 F (36.9 C) (Oral)  Resp 18  Ht  (1.727 m)  Wt 235 lb (106.595 kg)  BMI 35.74 kg/m2  SpO2 99% Physical Exam  Constitutional: He is oriented to person, place, and time. He appears well-developed and well-nourished.  HENT:  Head: Normocephalic and atraumatic.  Eyes: Conjunctivae and EOM are normal. Pupils are equal, round, and reactive to light.  Neck: Normal range of motion. Neck supple.  Cardiovascular: Normal rate and regular rhythm.   Pulmonary/Chest: No respiratory distress. He has decreased breath sounds. He has wheezes. He has no rhonchi. He has no rales.  Abdominal: Soft. There is no tenderness.  Musculoskeletal: Normal range of motion.  Neurological: He is alert and oriented to person, place, and time. No cranial nerve deficit.  Skin: Skin is warm and dry.  Psychiatric: He has a normal mood and affect. His behavior is normal.  Nursing note and vitals reviewed.   ED Course  Procedures (including critical care time) Albuterol/atrovent Neb treatment, CXR  Re examined after Neb treatment and patient without wheezing and states he feels much better Labs  Review Labs Reviewed - No data to display  Imaging Review Dg Chest 2 View  06/20/2015   CLINICAL DATA:  29 year old male with central chest tightness and left-sided chest pain with deep inspiration  EXAM: CHEST  2 VIEW  COMPARISON:  Prior chest x-ray 09/05/2014  FINDINGS: The lungs are clear and negative for focal airspace consolidation, pulmonary edema or suspicious pulmonary nodule. No pleural effusion or pneumothorax. Cardiac and mediastinal contours are within normal limits. No acute fracture or lytic or blastic osseous lesions. The visualized upper abdominal bowel gas pattern is unremarkable.  IMPRESSION: Negative chest x-ray.   Electronically Signed   By: Malachy Moan M.D.   On: 06/20/2015 18:20    MDM  29 y.o. male with cough and wheezing and episodes of persistent cough that he can't get to stop. Stable for d/c without respiratory distress O2 SAT 99% on R/A. Will treat with prednisone and patient will continue to use his home medications as directed. He will return as needed for worsening symptoms.   Final diagnoses:  Asthmatic bronchitis, mild intermittent, with acute exacerbation       Janne Napoleon, NP 06/21/15 0206  Benjiman Core, MD 06/25/15 1537

## 2016-01-07 ENCOUNTER — Encounter (HOSPITAL_COMMUNITY): Payer: Self-pay

## 2016-01-07 ENCOUNTER — Emergency Department (HOSPITAL_COMMUNITY)
Admission: EM | Admit: 2016-01-07 | Discharge: 2016-01-07 | Disposition: A | Discharge status: Home / Self Care | Payer: Self-pay | Attending: Dermatology | Admitting: Dermatology

## 2016-01-07 DIAGNOSIS — Z87891 Personal history of nicotine dependence: Secondary | ICD-10-CM | POA: Insufficient documentation

## 2016-01-07 DIAGNOSIS — Z5321 Procedure and treatment not carried out due to patient leaving prior to being seen by health care provider: Secondary | ICD-10-CM | POA: Insufficient documentation

## 2016-01-07 DIAGNOSIS — J45909 Unspecified asthma, uncomplicated: Secondary | ICD-10-CM | POA: Insufficient documentation

## 2016-01-07 DIAGNOSIS — I10 Essential (primary) hypertension: Secondary | ICD-10-CM | POA: Insufficient documentation

## 2016-01-07 NOTE — ED Notes (Signed)
Patient left ED at this time per ED registration

## 2016-01-07 NOTE — ED Notes (Signed)
Patient states high blood pressure today [facility].

## 2016-01-20 ENCOUNTER — Emergency Department (HOSPITAL_COMMUNITY)
Admission: EM | Admit: 2016-01-20 | Discharge: 2016-01-20 | Disposition: A | Discharge status: Home / Self Care | Payer: Self-pay | Attending: Emergency Medicine | Admitting: Emergency Medicine

## 2016-01-20 ENCOUNTER — Encounter (HOSPITAL_COMMUNITY): Payer: Self-pay | Admitting: Emergency Medicine

## 2016-01-20 DIAGNOSIS — I10 Essential (primary) hypertension: Secondary | ICD-10-CM | POA: Insufficient documentation

## 2016-01-20 DIAGNOSIS — J45909 Unspecified asthma, uncomplicated: Secondary | ICD-10-CM | POA: Insufficient documentation

## 2016-01-20 DIAGNOSIS — Z79899 Other long term (current) drug therapy: Secondary | ICD-10-CM | POA: Insufficient documentation

## 2016-01-20 DIAGNOSIS — Z87891 Personal history of nicotine dependence: Secondary | ICD-10-CM | POA: Insufficient documentation

## 2016-01-20 DIAGNOSIS — Z791 Long term (current) use of non-steroidal anti-inflammatories (NSAID): Secondary | ICD-10-CM | POA: Insufficient documentation

## 2016-01-20 DIAGNOSIS — N342 Other urethritis: Secondary | ICD-10-CM | POA: Insufficient documentation

## 2016-01-20 LAB — URINALYSIS, ROUTINE W REFLEX MICROSCOPIC
BILIRUBIN URINE: NEGATIVE
Glucose, UA: NEGATIVE mg/dL
HGB URINE DIPSTICK: NEGATIVE
Ketones, ur: NEGATIVE mg/dL
Leukocytes, UA: NEGATIVE
NITRITE: NEGATIVE
Protein, ur: NEGATIVE mg/dL
Specific Gravity, Urine: 1.02 (ref 1.005–1.030)
pH: 6 (ref 5.0–8.0)

## 2016-01-20 MED ORDER — AZITHROMYCIN 250 MG PO TABS
1000.0000 mg | ORAL_TABLET | Freq: Once | ORAL | Status: AC
  Administered 2016-01-20: 1000 mg via ORAL
  Filled 2016-01-20: qty 4

## 2016-01-20 MED ORDER — CEFTRIAXONE SODIUM 250 MG IJ SOLR
250.0000 mg | Freq: Once | INTRAMUSCULAR | Status: AC
  Administered 2016-01-20: 250 mg via INTRAMUSCULAR
  Filled 2016-01-20: qty 250

## 2016-01-20 MED ORDER — LIDOCAINE HCL (PF) 1 % IJ SOLN
INTRAMUSCULAR | Status: AC
  Administered 2016-01-20: 0.9 mL
  Filled 2016-01-20: qty 5

## 2016-01-20 MED ORDER — HYDROCHLOROTHIAZIDE 25 MG PO TABS: 25.0000 mg | ORAL_TABLET | Freq: Every day | ORAL | Status: AC

## 2016-01-20 NOTE — ED Provider Notes (Signed)
CSN: [id number]     Arrival date & time 01/20/16  1257 History   First MD Initiated Contact with Patient 01/20/16 1331     Chief Complaint  Patient presents with  . Exposure to STD     (Consider location/radiation/quality/duration/timing/severity/associated sxs/prior Treatment) Patient is a 30 y.o. male presenting with STD exposure. The history is provided by the patient.  Exposure to STD This is a new problem. The current episode started yesterday. The problem occurs constantly. The problem has been unchanged. Nothing aggravates the symptoms. He has tried nothing for the symptoms. The treatment provided no relief.    Past Medical History  Diagnosis Date  . Asthma   . Bronchitis   . Hypertension   . Bronchitis    History reviewed. No pertinent past surgical history. History reviewed. No pertinent family history. Social History  Substance Use Topics  . Smoking status: Former Smoker -- 0.03 packs/day for 2 years    Types: Cigarettes  . Smokeless tobacco: Never Used  . Alcohol Use: No    Review of Systems  All other systems reviewed and are negative.     Allergies  Robitussin  Home Medications   Prior to Admission medications   Medication Sig Start Date End Date Taking? Authorizing Provider  albuterol (PROVENTIL HFA;VENTOLIN HFA) 108 (90 BASE) MCG/ACT inhaler Inhale 2 puffs into the lungs every 4 (four) hours as needed. 09/05/14  Yes Devoria AlbeIva Knapp, MD  ibuprofen (ADVIL,MOTRIN) 200 MG tablet Take 400 mg by mouth every 6 (six) hours as needed for pain.   Yes Historical Provider, MD   BP 160/100 mmHg  Pulse 81  Temp(Src) 97.8 F (36.6 C) (Oral)  Ht 5\' 8"  (1.727 m)  Wt 104.781 kg  BMI 35.13 kg/m2  SpO2 99% Physical Exam  Constitutional: He is oriented to person, place, and time. He appears well-developed and well-nourished.  HENT:  Head: Normocephalic.  Eyes: EOM are normal.  Neck: Normal range of motion.  Pulmonary/Chest: Effort normal.  Abdominal: He exhibits no  distension.  Genitourinary: Penis normal.  Musculoskeletal: Normal range of motion.  Neurological: He is alert and oriented to person, place, and time.  Psychiatric: He has a normal mood and affect.  Nursing note and vitals reviewed.   ED Course  Procedures (including critical care time) Labs Review Labs Reviewed  URINALYSIS, ROUTINE W REFLEX MICROSCOPIC (NOT AT [facility])  GC/CHLAMYDIA PROBE AMP ([place]) NOT AT [facility]    Imaging Review No results found. I have personally reviewed and evaluated these images and lab results as part of my medical decision-making.   EKG Interpretation None      MDM gc and ct pending   Final diagnoses:  Urethritis   Meds ordered this encounter  Medications  . cefTRIAXone (ROCEPHIN) injection 250 mg    Sig:     Order Specific Question:  Antibiotic Indication:    Answer:  STD  . azithromycin (ZITHROMAX) tablet 1,000 mg    Sig:   An After Visit Summary was printed and given to the patient.    Lonia SkinnerLeslie K [place] EstatesSofia, PA-C 01/20/16 1342  Nelva Nayobert Beaton, MD 01/20/16 1345

## 2016-01-20 NOTE — Discharge Instructions (Signed)
Urethritis, Adult °Urethritis is an inflammation of the tube through which urine exits your bladder (urethra).  °CAUSES °Urethritis is often caused by an infection in your urethra. The infection can be viral, like herpes. The infection can also be bacterial, like gonorrhea. °RISK FACTORS °Risk factors of urethritis include: °· Having sex without using a condom. °· Having multiple sexual partners. °· Having poor hygiene. °SIGNS AND SYMPTOMS °Symptoms of urethritis are less noticeable in women than in men. These symptoms include: °· Burning feeling when you urinate (dysuria). °· Discharge from your urethra. °· Blood in your urine (hematuria). °· Urinating more than usual. °DIAGNOSIS  °To confirm a diagnosis of urethritis, your health care provider will do the following: °· Ask about your sexual history. °· Perform a physical exam. °· Have you provide a sample of your urine for lab testing. °· Use a cotton swab to gently collect a sample from your urethra for lab testing. °TREATMENT  °It is important to treat urethritis. Depending on the cause, untreated urethritis may lead to serious genital infections and possibly infertility. Urethritis caused by a bacterial infection is treated with antibiotic medicine. All sexual partners must be treated.  °HOME CARE INSTRUCTIONS °· Do not have sex until the test results are known and treatment is completed, even if your symptoms go away before you finish treatment. °· If you were prescribed an antibiotic, finish it all even if you start to feel better. °SEEK MEDICAL CARE IF:  °· Your symptoms are not improved in 3 days. °· Your symptoms are getting worse. °· You develop abdominal pain or pelvic pain (in women). °· You develop joint pain. °· You have a fever. °SEEK IMMEDIATE MEDICAL CARE IF:  °· You have severe pain in the belly, back, or side. °· You have repeated vomiting. °MAKE SURE YOU: °· Understand these instructions. °· Will watch your condition. °· Will get help right away  if you are not doing well or get worse. °  °This information is not intended to replace advice given to you by your health care provider. Make sure you discuss any questions you have with your health care provider. °  °Document Released: 02/25/2001 Document Revised: 01/16/2015 Document Reviewed: 05/02/2013 °Elsevier Interactive Patient Education ©2016 [employer] ° °

## 2016-01-20 NOTE — ED Notes (Signed)
Patient c/o burning with urination. Per patient started last night after having unprotected intercourse. Denies any lesions or discharge.

## 2016-01-21 LAB — GC/CHLAMYDIA PROBE AMP (~~LOC~~) NOT AT ARMC
Chlamydia: NEGATIVE
Neisseria Gonorrhea: NEGATIVE

## 2016-01-23 ENCOUNTER — Encounter (HOSPITAL_COMMUNITY): Payer: Self-pay

## 2016-01-23 ENCOUNTER — Emergency Department (HOSPITAL_COMMUNITY)
Admission: EM | Admit: 2016-01-23 | Discharge: 2016-01-23 | Disposition: A | Discharge status: Home / Self Care | Payer: Self-pay | Attending: Emergency Medicine | Admitting: Emergency Medicine

## 2016-01-23 DIAGNOSIS — Z87891 Personal history of nicotine dependence: Secondary | ICD-10-CM | POA: Insufficient documentation

## 2016-01-23 DIAGNOSIS — R300 Dysuria: Secondary | ICD-10-CM | POA: Insufficient documentation

## 2016-01-23 DIAGNOSIS — J45909 Unspecified asthma, uncomplicated: Secondary | ICD-10-CM | POA: Insufficient documentation

## 2016-01-23 DIAGNOSIS — I10 Essential (primary) hypertension: Secondary | ICD-10-CM | POA: Insufficient documentation

## 2016-01-23 NOTE — ED Provider Notes (Signed)
CSN: [id number]     Arrival date & time 01/23/16  1511 History   First MD Initiated Contact with Patient 01/23/16 1606     Chief Complaint  Patient presents with  . S74.5      (Consider location/radiation/quality/duration/timing/severity/associated sxs/prior Treatment) Patient is a 30 y.o. male presenting with penile discharge. The history is provided by the patient. No language interpreter was used.  Penile Discharge This is a new problem. The current episode started today. The problem occurs constantly. The problem has been gradually worsening. Associated symptoms comments: Burning with urination. Nothing aggravates the symptoms. He has tried nothing for the symptoms.  Pt seen here 5/7.  Negative gc and ct.  Partner seen and treated today.  Pt feels like he has again.    Past Medical History  Diagnosis Date  . Asthma   . Bronchitis   . Hypertension   . Bronchitis    History reviewed. No pertinent past surgical history. No family history on file. Social History  Substance Use Topics  . Smoking status: Former Smoker -- 0.03 packs/day for 2 years    Types: Cigarettes  . Smokeless tobacco: Never Used  . Alcohol Use: No    Review of Systems  Genitourinary: Positive for discharge.  All other systems reviewed and are negative.     Allergies  Robitussin  Home Medications   Prior to Admission medications   Medication Sig Start Date End Date Taking? Authorizing Provider  albuterol (PROVENTIL HFA;VENTOLIN HFA) 108 (90 BASE) MCG/ACT inhaler Inhale 2 puffs into the lungs every 4 (four) hours as needed. 09/05/14   Devoria AlbeIva Knapp, MD  hydrochlorothiazide (HYDRODIURIL) 25 MG tablet Take 1 tablet (25 mg total) by mouth daily. 01/20/16   Elson AreasLeslie K Sofia, PA-C  ibuprofen (ADVIL,MOTRIN) 200 MG tablet Take 400 mg by mouth every 6 (six) hours as needed for pain.    Historical Provider, MD   BP 156/90 mmHg  Pulse 94  Temp(Src) 98.2 F (36.8 C) (Oral)  Resp 20  Ht 5\' 6"  (1.676 m)  Wt  104.781 kg  BMI 37.30 kg/m2  SpO2 97% Physical Exam  Constitutional: He is oriented to person, place, and time. He appears well-developed and well-nourished.  HENT:  Head: Normocephalic.  Eyes: EOM are normal.  Neck: Normal range of motion.  Pulmonary/Chest: Effort normal.  Abdominal: He exhibits no distension.  Genitourinary: Penis normal.  Musculoskeletal: Normal range of motion.  Neurological: He is alert and oriented to person, place, and time.  Psychiatric: He has a normal mood and affect.  Nursing note and vitals reviewed.   ED Course  Procedures (including critical care time) Labs Review Labs Reviewed  URINALYSIS, ROUTINE W REFLEX MICROSCOPIC (NOT AT [facility])  GC/CHLAMYDIA PROBE AMP ([place]) NOT AT [facility]    Imaging Review No results found. I have personally reviewed and evaluated these images and lab results as part of my medical decision-making.   EKG Interpretation None      MDM  GC and ct pending  I advised pt to follow up at health dept.  Pt not treated today as he just had negative gc and ct.   Pt counseled again on condoms.    Final diagnoses:  Dysuria        Elson AreasLeslie K Sofia, PA-C 01/23/16 1623

## 2016-01-23 NOTE — ED Notes (Signed)
Pt reports was here Sunday and was treated for urethritis.  Reports had sex with his partner last night and woke up this morning with burning with urination again.

## 2016-01-23 NOTE — Discharge Instructions (Signed)
Safe Sex  Safe sex is about reducing the risk of giving or getting a sexually transmitted disease (STD). STDs are spread through sexual contact involving the genitals, mouth, or rectum. Some STDs can be cured and others cannot. Safe sex can also prevent unintended pregnancies.   WHAT ARE SOME SAFE SEX PRACTICES?  · Limit your sexual activity to only one partner who is having sex with only you.  · Talk to your partner about his or her past partners, past STDs, and drug use.  · Use a condom every time you have sexual intercourse. This includes vaginal, oral, and anal sexual activity. Both females and males should wear condoms during oral sex. Only use latex or polyurethane condoms and water-based lubricants. Using petroleum-based lubricants or oils to lubricate a condom will weaken the condom and increase the chance that it will break. The condom should be in place from the beginning to the end of sexual activity. Wearing a condom reduces, but does not completely eliminate, your risk of getting or giving an STD. STDs can be spread by contact with infected body fluids and skin.  · Get vaccinated for hepatitis B and HPV.  · Avoid alcohol and recreational drugs, which can affect your judgment. You may forget to use a condom or participate in high-risk sex.  · For females, avoid douching after sexual intercourse. Douching can spread an infection farther into the reproductive tract.  · Check your body for signs of sores, blisters, rashes, or unusual discharge. See your health care provider if you notice any of these signs.  · Avoid sexual contact if you have symptoms of an infection or are being treated for an STD. If you or your partner has herpes, avoid sexual contact when blisters are present. Use condoms at all other times.  · If you are at risk of being infected with HIV, it is recommended that you take a prescription medicine daily to prevent HIV infection. This is called pre-exposure prophylaxis (PrEP). You are  considered at risk if:    You are a man who has sex with other men (MSM).    You are a heterosexual man or woman who is sexually active with more than one partner.    You take drugs by injection.    You are sexually active with a partner who has HIV.  · Talk with your health care provider about whether you are at high risk of being infected with HIV. If you choose to begin PrEP, you should first be tested for HIV. You should then be tested every 3 months for as long as you are taking PrEP.  · See your health care provider for regular screenings, exams, and tests for other STDs. Before having sex with a new partner, each of you should be screened for STDs and should talk about the results with each other.  WHAT ARE THE BENEFITS OF SAFE SEX?   · There is less chance of getting or giving an STD.  · You can prevent unwanted or unintended pregnancies.  · By discussing safe sex concerns with your partner, you may increase feelings of intimacy, comfort, trust, and honesty between the two of you.     This information is not intended to replace advice given to you by your health care provider. Make sure you discuss any questions you have with your health care provider.     Document Released: 10/09/2004 Document Revised: 09/22/2014 Document Reviewed: 02/23/2012  Elsevier Interactive Patient Education ©2016 [employer]

## 2016-01-24 LAB — GC/CHLAMYDIA PROBE AMP (~~LOC~~) NOT AT ARMC
Chlamydia: NEGATIVE
Neisseria Gonorrhea: NEGATIVE

## 2016-03-29 ENCOUNTER — Encounter (HOSPITAL_COMMUNITY): Payer: Self-pay | Admitting: *Deleted

## 2016-03-29 ENCOUNTER — Emergency Department (HOSPITAL_COMMUNITY)
Admission: EM | Admit: 2016-03-29 | Discharge: 2016-03-30 | Disposition: A | Discharge status: Home / Self Care | Payer: Self-pay | Attending: Emergency Medicine | Admitting: Emergency Medicine

## 2016-03-29 DIAGNOSIS — J45909 Unspecified asthma, uncomplicated: Secondary | ICD-10-CM | POA: Insufficient documentation

## 2016-03-29 DIAGNOSIS — Z791 Long term (current) use of non-steroidal anti-inflammatories (NSAID): Secondary | ICD-10-CM | POA: Insufficient documentation

## 2016-03-29 DIAGNOSIS — Z79899 Other long term (current) drug therapy: Secondary | ICD-10-CM | POA: Insufficient documentation

## 2016-03-29 DIAGNOSIS — I10 Essential (primary) hypertension: Secondary | ICD-10-CM | POA: Insufficient documentation

## 2016-03-29 DIAGNOSIS — Z87891 Personal history of nicotine dependence: Secondary | ICD-10-CM | POA: Insufficient documentation

## 2016-03-29 DIAGNOSIS — N342 Other urethritis: Secondary | ICD-10-CM | POA: Insufficient documentation

## 2016-03-29 NOTE — ED Notes (Signed)
Pt reports dysuria and a whitish discharge coming from penis that started today.

## 2016-03-30 LAB — URINALYSIS, ROUTINE W REFLEX MICROSCOPIC
BILIRUBIN URINE: NEGATIVE
Glucose, UA: NEGATIVE mg/dL
Hgb urine dipstick: NEGATIVE
Ketones, ur: NEGATIVE mg/dL
Leukocytes, UA: NEGATIVE
NITRITE: NEGATIVE
PH: 6.5 (ref 5.0–8.0)
Protein, ur: NEGATIVE mg/dL
SPECIFIC GRAVITY, URINE: 1.02 (ref 1.005–1.030)

## 2016-03-30 MED ORDER — CEFTRIAXONE SODIUM 250 MG IJ SOLR
250.0000 mg | Freq: Once | INTRAMUSCULAR | Status: AC
  Administered 2016-03-30: 250 mg via INTRAMUSCULAR

## 2016-03-30 MED ORDER — METRONIDAZOLE 500 MG PO TABS: 2000.0000 mg | ORAL_TABLET | Freq: Once | ORAL | Status: DC

## 2016-03-30 MED ORDER — LIDOCAINE HCL (PF) 1 % IJ SOLN
INTRAMUSCULAR | Status: AC
  Administered 2016-03-30: 1.2 mL
  Filled 2016-03-30: qty 5

## 2016-03-30 MED ORDER — AZITHROMYCIN 250 MG PO TABS
ORAL_TABLET | ORAL | Status: AC
  Filled 2016-03-30: qty 4

## 2016-03-30 MED ORDER — AZITHROMYCIN 250 MG PO TABS
1000.0000 mg | ORAL_TABLET | Freq: Once | ORAL | Status: AC
Start: 2016-03-30 — End: 2016-03-30
  Administered 2016-03-30: 1000 mg via ORAL

## 2016-03-30 MED ORDER — CEFTRIAXONE SODIUM 250 MG IJ SOLR
INTRAMUSCULAR | Status: AC
  Filled 2016-03-30: qty 250

## 2016-03-30 NOTE — ED Provider Notes (Signed)
CSN: [id number]     Arrival date & time 03/29/16  2342 History   First MD Initiated Contact with Patient 03/29/16 2356     Chief Complaint  Patient presents with  . SEXUALLY TRANSMITTED DISEASE     (Consider location/radiation/quality/duration/timing/severity/associated sxs/prior Treatment) HPI   Robert Drake is a 30 y.o. male who presents to the Emergency Department complaining of burning with urination and white penil discharge onset today.  He reports having a new sexual partner.  He experienced burning with urination this morning and noticed a small amt of milky discharge this evening.  Reports having similar symptoms 2 months ago.  He denies fever, abdominal pain, back pain, genital lesions, testicular pain or swelling.     Past Medical History  Diagnosis Date  . Asthma   . Bronchitis   . Hypertension   . Bronchitis    History reviewed. No pertinent past surgical history. History reviewed. No pertinent family history. Social History  Substance Use Topics  . Smoking status: Former Smoker -- 0.03 packs/day for 2 years    Types: Cigarettes  . Smokeless tobacco: Never Used  . Alcohol Use: No    Review of Systems  Constitutional: Negative for fever, chills, activity change and appetite change.  Respiratory: Negative for chest tightness and shortness of breath.   Gastrointestinal: Negative for nausea, vomiting and abdominal pain.  Genitourinary: Positive for dysuria and discharge. Negative for hematuria, flank pain, decreased urine volume, penile swelling, scrotal swelling, difficulty urinating, genital sores, penile pain and testicular pain.  Musculoskeletal: Negative for back pain.  Skin: Negative for rash.  Neurological: Negative for dizziness, weakness and numbness.  Hematological: Negative for adenopathy.  Psychiatric/Behavioral: Negative for confusion.  All other systems reviewed and are negative.     Allergies  Robitussin  Home Medications   Prior to  Admission medications   Medication Sig Start Date End Date Taking? Authorizing Provider  albuterol (PROVENTIL HFA;VENTOLIN HFA) 108 (90 BASE) MCG/ACT inhaler Inhale 2 puffs into the lungs every 4 (four) hours as needed. 09/05/14   Devoria Albe, MD  hydrochlorothiazide (HYDRODIURIL) 25 MG tablet Take 1 tablet (25 mg total) by mouth daily. 01/20/16   Elson Areas, PA-C  ibuprofen (ADVIL,MOTRIN) 200 MG tablet Take 400 mg by mouth every 6 (six) hours as needed for pain.    Historical Provider, MD   BP 149/95 mmHg  Pulse 85  Temp(Src) 98.4 F (36.9 C) (Oral)  Resp 18  Ht  (1.676 m)  Wt 105.688 kg  BMI 37.63 kg/m2  SpO2 97% Physical Exam  Constitutional: He is oriented to person, place, and time. He appears well-developed and well-nourished. No distress.  HENT:  Mouth/Throat: Oropharynx is clear and moist.  Cardiovascular: Normal rate and regular rhythm.   Pulmonary/Chest: Effort normal and breath sounds normal. No respiratory distress.  Abdominal: Soft. He exhibits no distension. There is no tenderness. There is no rebound.  Genitourinary: Testes normal. Cremasteric reflex is present. Circumcised. No penile tenderness.  Exam chaperoned by nursing staff.  No penile discharge seen on swab.  Penis nml appearing.  No lesions  Musculoskeletal: Normal range of motion.  Lymphadenopathy:       Right: No inguinal adenopathy present.       Left: No inguinal adenopathy present.  Neurological: He is alert and oriented to person, place, and time. Coordination normal.  Skin: Skin is warm and dry. No rash noted.  Psychiatric: He has a normal mood and affect.  Nursing note and vitals  reviewed.   ED Course  Procedures (including critical care time) Labs Review Labs Reviewed  URINALYSIS, ROUTINE W REFLEX MICROSCOPIC (NOT AT [facility])  RPR  GC/CHLAMYDIA PROBE AMP ([place]) NOT AT [facility]    Imaging Review No results found. I have personally reviewed and evaluated these images and lab results as  part of my medical decision-making.   EKG Interpretation None      MDM   Final diagnoses:  Urethritis    Previous records reviewed.  Pt seen here x 2 in May for same.  Neg for GC and CT on both occurences.  Pt well appearing.    IM rocephin and po Zithromax given here.  RX for flagyl    Pauline Ausammy Mona Ayars, PA-C 03/30/16 0106  Gilda Creasehristopher J Pollina, MD 03/30/16 [phone]

## 2016-03-30 NOTE — Discharge Instructions (Signed)
Urethritis, Adult °Urethritis is an inflammation of the tube through which urine exits your bladder (urethra).  °CAUSES °Urethritis is often caused by an infection in your urethra. The infection can be viral, like herpes. The infection can also be bacterial, like gonorrhea. °RISK FACTORS °Risk factors of urethritis include: °· Having sex without using a condom. °· Having multiple sexual partners. °· Having poor hygiene. °SIGNS AND SYMPTOMS °Symptoms of urethritis are less noticeable in women than in men. These symptoms include: °· Burning feeling when you urinate (dysuria). °· Discharge from your urethra. °· Blood in your urine (hematuria). °· Urinating more than usual. °DIAGNOSIS  °To confirm a diagnosis of urethritis, your health care provider will do the following: °· Ask about your sexual history. °· Perform a physical exam. °· Have you provide a sample of your urine for lab testing. °· Use a cotton swab to gently collect a sample from your urethra for lab testing. °TREATMENT  °It is important to treat urethritis. Depending on the cause, untreated urethritis may lead to serious genital infections and possibly infertility. Urethritis caused by a bacterial infection is treated with antibiotic medicine. All sexual partners must be treated.  °HOME CARE INSTRUCTIONS °· Do not have sex until the test results are known and treatment is completed, even if your symptoms go away before you finish treatment. °· If you were prescribed an antibiotic, finish it all even if you start to feel better. °SEEK MEDICAL CARE IF:  °· Your symptoms are not improved in 3 days. °· Your symptoms are getting worse. °· You develop abdominal pain or pelvic pain (in women). °· You develop joint pain. °· You have a fever. °SEEK IMMEDIATE MEDICAL CARE IF:  °· You have severe pain in the belly, back, or side. °· You have repeated vomiting. °MAKE SURE YOU: °· Understand these instructions. °· Will watch your condition. °· Will get help right away  if you are not doing well or get worse. °  °This information is not intended to replace advice given to you by your health care provider. Make sure you discuss any questions you have with your health care provider. °  °Document Released: 02/25/2001 Document Revised: 01/16/2015 Document Reviewed: 05/02/2013 °Elsevier Interactive Patient Education ©2016 [employer] ° °

## 2016-03-31 LAB — GC/CHLAMYDIA PROBE AMP (~~LOC~~) NOT AT ARMC
Chlamydia: NEGATIVE
Neisseria Gonorrhea: NEGATIVE

## 2016-03-31 LAB — RPR: RPR: NONREACTIVE

## 2016-04-02 NOTE — ED Provider Notes (Signed)
Medical screening examination/treatment/procedure(s) were performed by non-physician practitioner and as supervising physician I was immediately available for consultation/collaboration.   EKG Interpretation None       Perla Echavarria, MD 04/02/16 1602 

## 2016-10-26 ENCOUNTER — Encounter (HOSPITAL_COMMUNITY): Payer: Self-pay

## 2016-10-26 ENCOUNTER — Emergency Department (HOSPITAL_COMMUNITY)
Admission: EM | Admit: 2016-10-26 | Discharge: 2016-10-26 | Disposition: A | Discharge status: Home / Self Care | Payer: Self-pay | Attending: Emergency Medicine | Admitting: Emergency Medicine

## 2016-10-26 DIAGNOSIS — Z79899 Other long term (current) drug therapy: Secondary | ICD-10-CM | POA: Insufficient documentation

## 2016-10-26 DIAGNOSIS — I10 Essential (primary) hypertension: Secondary | ICD-10-CM | POA: Insufficient documentation

## 2016-10-26 DIAGNOSIS — N342 Other urethritis: Secondary | ICD-10-CM | POA: Insufficient documentation

## 2016-10-26 DIAGNOSIS — J45909 Unspecified asthma, uncomplicated: Secondary | ICD-10-CM | POA: Insufficient documentation

## 2016-10-26 DIAGNOSIS — Z87891 Personal history of nicotine dependence: Secondary | ICD-10-CM | POA: Insufficient documentation

## 2016-10-26 LAB — URINALYSIS, ROUTINE W REFLEX MICROSCOPIC
Bilirubin Urine: NEGATIVE
Glucose, UA: NEGATIVE mg/dL
Hgb urine dipstick: NEGATIVE
KETONES UR: NEGATIVE mg/dL
Nitrite: NEGATIVE
PH: 6 (ref 5.0–8.0)
Protein, ur: NEGATIVE mg/dL
SPECIFIC GRAVITY, URINE: 1.021 (ref 1.005–1.030)

## 2016-10-26 MED ORDER — AZITHROMYCIN 250 MG PO TABS
1000.0000 mg | ORAL_TABLET | Freq: Once | ORAL | Status: AC
  Administered 2016-10-26: 1000 mg via ORAL
  Filled 2016-10-26: qty 4

## 2016-10-26 MED ORDER — LIDOCAINE HCL (PF) 1 % IJ SOLN
INTRAMUSCULAR | Status: AC
  Filled 2016-10-26: qty 5

## 2016-10-26 MED ORDER — CEFTRIAXONE SODIUM 250 MG IJ SOLR
250.0000 mg | Freq: Once | INTRAMUSCULAR | Status: AC
  Administered 2016-10-26: 250 mg via INTRAMUSCULAR
  Filled 2016-10-26: qty 250

## 2016-10-26 NOTE — Discharge Instructions (Signed)
Your urine test suggest urethral infection. The results of your gonorrhea/chlamydia tests will be available in about 3 days. You were treated this afternoon with Zithromax and Rocephin. Please refrain from all sexual activity over the next 7 days. Someone from the flow managers office will notify you if any of your testing is positive. Please practice safe sex.

## 2016-10-26 NOTE — ED Notes (Signed)
Burning on urination on Friday.  White discharge to penis noted this morning.

## 2016-10-26 NOTE — ED Triage Notes (Signed)
Pt reports 2 day history of penile discharge, dysuria. Reports new sexual contact with unprotected sex.

## 2016-10-26 NOTE — ED Provider Notes (Signed)
[facility] DEPT Provider Note   CSN: [id number] Arrival date & time: 10/26/16  1043  By signing my name below, I, Cynda AcresHailei Fulton, attest that this documentation has been prepared under the direction and in the presence of Ivery QualeHobson Yacoub Diltz PA-C Electronically Signed: Cynda AcresHailei Fulton, Scribe. 10/26/16. 2:59 PM.  History   Chief Complaint Chief Complaint  Patient presents with  . Penile Discharge    HPI Comments: Robert Drake is a 31 y.o. male with a hx of gonorrhea, who presents to the Emergency Department complaining of sudden-onset, constant white penile discharge that began this morning. Patient states he has had constant discharge since using this bathroom this morning. Patient reports associated penile burning that originally began 5 days ago. No modifying factors indicated. No recent changes in sexual partner. Patient states he has been checked for an STD before. Patient is here with current sexual partner. Patient denies any fever, nausea, vomiting, abdominal pain, or diarrhea.   The history is provided by the patient. No language interpreter was used.    Past Medical History:  Diagnosis Date  . Asthma   . Bronchitis   . Bronchitis   . Hypertension     There are no active problems to display for this patient.   History reviewed. No pertinent surgical history.     Home Medications    Prior to Admission medications   Medication Sig Start Date End Date Taking? Authorizing Provider  albuterol (PROVENTIL HFA;VENTOLIN HFA) 108 (90 BASE) MCG/ACT inhaler Inhale 2 puffs into the lungs every 4 (four) hours as needed. 09/05/14  Yes Devoria AlbeIva Knapp, MD  hydrochlorothiazide (HYDRODIURIL) 25 MG tablet Take 1 tablet (25 mg total) by mouth daily. 01/20/16  Yes Lonia SkinnerLeslie K Sofia, PA-C  mometasone-formoterol (DULERA) 200-5 MCG/ACT AERO Inhale 2 puffs into the lungs 2 (two) times daily.   Yes Historical Provider, MD  ibuprofen (ADVIL,MOTRIN) 200 MG tablet Take 400 mg by mouth every 6 (six) hours  as needed for pain.    Historical Provider, MD  metroNIDAZOLE (FLAGYL) 500 MG tablet Take 4 tablets (2,000 mg total) by mouth once. As single dose 03/30/16   Tammy Triplett, PA-C    Family History History reviewed. No pertinent family history.  Social History Social History  Substance Use Topics  . Smoking status: Former Smoker    Packs/day: 0.03    Years: 2.00    Types: Cigarettes  . Smokeless tobacco: Never Used  . Alcohol use No     Allergies   Robitussin [guaifenesin]   Review of Systems Review of Systems  Constitutional: Negative for chills and fever.  Gastrointestinal: Negative for abdominal pain, diarrhea, nausea and vomiting.  Genitourinary: Positive for discharge (White) and penile pain.  All other systems reviewed and are negative.    Physical Exam Updated Vital Signs BP (!) 135/102 (BP Location: Left Arm)   Pulse 69   Temp 98.6 F (37 C) (Oral)   Ht 5\' 9"  (1.753 m)   Wt 231 lb (104.8 kg)   SpO2 99%   BMI 34.11 kg/m   Physical Exam  Constitutional: He is oriented to person, place, and time. He appears well-developed.  HENT:  Head: Normocephalic and atraumatic.  Mouth/Throat: Oropharynx is clear and moist.  Eyes: Conjunctivae and EOM are normal. Pupils are equal, round, and reactive to light.  Neck: Normal range of motion. Neck supple.  Cardiovascular: Normal rate.   Pulmonary/Chest: Effort normal.  Abdominal: Soft. Bowel sounds are normal.  Genitourinary: No penile tenderness.  Genitourinary Comments: No  palpable nodes of the inguinal area. No testicular tenderness. No right or left hernia. No rash of the shaft of the penis or the head of the penis. There is a small amount of discharge at the urethral opening.   Musculoskeletal: Normal range of motion.  Neurological: He is alert and oriented to person, place, and time.  Skin: Skin is warm and dry.  Psychiatric: He has a normal mood and affect.     ED Treatments / Results  DIAGNOSTIC  STUDIES: Oxygen Saturation is 99% on RA, normal by my interpretation.    COORDINATION OF CARE: 2:58 PM Discussed treatment plan with pt at bedside and pt agreed to plan, which includes HIV/RBR testing and antibiotics (Zithromax and rocephin).    Labs (all labs ordered are listed, but only abnormal results are displayed) Labs Reviewed  URINALYSIS, ROUTINE W REFLEX MICROSCOPIC - Abnormal; Notable for the following:       Result Value   Leukocytes, UA MODERATE (*)    Bacteria, UA RARE (*)    All other components within normal limits  GC/CHLAMYDIA PROBE AMP ([place]) NOT AT [facility]    EKG  EKG Interpretation None       Radiology No results found.  Procedures Procedures (including critical care time)  Medications Ordered in ED Medications - No data to display   Initial Impression / Assessment and Plan / ED Course  I have reviewed the triage vital signs and the nursing notes.  Pertinent labs & imaging results that were available during my care of the patient were reviewed by me and considered in my medical decision making (see chart for details).     *I have reviewed nursing notes, vital signs, and all appropriate lab and imaging results for this patient.**  Final Clinical Impressions(s) / ED Diagnoses   MDM: Patient states he has a history of STDs before. He reports drainage from the penis x2 this morning. He will be treated with Zithromax and rocephin. Will obtain HIV and RPR testing. Patient advised his partner to be checked. She is concerned for the wait, she declined registration at this time. We agreed no sexual activity over the next 7 days, patient is in agreement with this plan.   Final diagnoses:  Urethritis    New Prescriptions New Prescriptions   No medications on file   I personally performed the services described in this documentation, which was scribed in my presence. The recorded information has been reviewed and is accurate.   Ivery Quale,  PA-C 10/26/16 1543    Benjiman Core, MD 10/27/16 [phone]

## 2016-10-27 LAB — GC/CHLAMYDIA PROBE AMP (~~LOC~~) NOT AT ARMC
CHLAMYDIA, DNA PROBE: NEGATIVE
Chlamydia: NEGATIVE
Neisseria Gonorrhea: POSITIVE — AB
Neisseria Gonorrhea: POSITIVE — AB

## 2016-10-27 LAB — HIV ANTIBODY (ROUTINE TESTING W REFLEX): HIV Screen 4th Generation wRfx: NONREACTIVE

## 2016-10-27 LAB — RPR: RPR Ser Ql: NONREACTIVE

## 2016-11-11 ENCOUNTER — Emergency Department (HOSPITAL_COMMUNITY)
Admission: EM | Admit: 2016-11-11 | Discharge: 2016-11-11 | Disposition: A | Discharge status: Home / Self Care | Payer: Self-pay | Attending: Emergency Medicine | Admitting: Emergency Medicine

## 2016-11-11 ENCOUNTER — Encounter (HOSPITAL_COMMUNITY): Payer: Self-pay | Admitting: *Deleted

## 2016-11-11 DIAGNOSIS — R05 Cough: Secondary | ICD-10-CM | POA: Insufficient documentation

## 2016-11-11 DIAGNOSIS — R0981 Nasal congestion: Secondary | ICD-10-CM | POA: Insufficient documentation

## 2016-11-11 DIAGNOSIS — Z791 Long term (current) use of non-steroidal anti-inflammatories (NSAID): Secondary | ICD-10-CM | POA: Insufficient documentation

## 2016-11-11 DIAGNOSIS — J45909 Unspecified asthma, uncomplicated: Secondary | ICD-10-CM | POA: Insufficient documentation

## 2016-11-11 DIAGNOSIS — Z87891 Personal history of nicotine dependence: Secondary | ICD-10-CM | POA: Insufficient documentation

## 2016-11-11 DIAGNOSIS — R69 Illness, unspecified: Secondary | ICD-10-CM

## 2016-11-11 DIAGNOSIS — R52 Pain, unspecified: Secondary | ICD-10-CM | POA: Insufficient documentation

## 2016-11-11 DIAGNOSIS — J111 Influenza due to unidentified influenza virus with other respiratory manifestations: Secondary | ICD-10-CM

## 2016-11-11 DIAGNOSIS — Z79899 Other long term (current) drug therapy: Secondary | ICD-10-CM | POA: Insufficient documentation

## 2016-11-11 DIAGNOSIS — I10 Essential (primary) hypertension: Secondary | ICD-10-CM | POA: Insufficient documentation

## 2016-11-11 MED ORDER — ACETAMINOPHEN 325 MG PO TABS
650.0000 mg | ORAL_TABLET | Freq: Once | ORAL | Status: AC
  Administered 2016-11-11: 650 mg via ORAL
  Filled 2016-11-11: qty 2

## 2016-11-11 MED ORDER — OSELTAMIVIR PHOSPHATE 75 MG PO CAPS: 75.0000 mg | ORAL_CAPSULE | Freq: Two times a day (BID) | ORAL | 0 refills | Status: DC

## 2016-11-11 NOTE — ED Triage Notes (Signed)
Pt comes in with cough, congestion, sinus pressure, fever and chills. Pt states he is eating and drinking with no difficulties.

## 2016-11-11 NOTE — Discharge Instructions (Signed)
Take over the counter tylenol and ibuprofen, as directed on packaging, as needed for discomfort.  Take over the counter decongestant (such as sudafed), as directed on packaging, for the next week.  Use over the counter normal saline nasal spray, with frequent nose blowing, several times per day for the next 2 weeks. Use your albuterol inhaler, 2 to 4 puffs every 4 hours, as needed for cough. Call your regular medical doctor today to schedule a follow up appointment this week.  Return to the Emergency Department immediately if worsening.

## 2016-11-11 NOTE — ED Provider Notes (Signed)
[facility] Provider Note   CSN: [id number] Arrival date & time: 11/11/16  1305     History   Chief Complaint Chief Complaint  Patient presents with  . flu-like symptoms    HPI Robert Drake is a 31 y.o. male.  HPI  Pt was seen at 1400.  Per pt, c/o gradual onset and persistence of constant runny/stuffy nose, sinus congestion, cough, generalized body aches, and "chills" for the past 2 days.  Denies rash, no CP/SOB, no N/V/D, no abd pain.    Past Medical History:  Diagnosis Date  . Asthma   . Bronchitis   . Bronchitis   . Hypertension     There are no active problems to display for this patient.   History reviewed. No pertinent surgical history.     Home Medications    Prior to Admission medications   Medication Sig Start Date End Date Taking? Authorizing Provider  albuterol (PROVENTIL HFA;VENTOLIN HFA) 108 (90 BASE) MCG/ACT inhaler Inhale 2 puffs into the lungs every 4 (four) hours as needed. 09/05/14  Yes Devoria Albe, MD  hydrochlorothiazide (HYDRODIURIL) 25 MG tablet Take 1 tablet (25 mg total) by mouth daily. 01/20/16  Yes Lonia Skinner Sofia, PA-C  mometasone-formoterol (DULERA) 200-5 MCG/ACT AERO Inhale 2 puffs into the lungs 2 (two) times daily.   Yes Historical Provider, MD  ibuprofen (ADVIL,MOTRIN) 200 MG tablet Take 400 mg by mouth every 6 (six) hours as needed for pain.    Historical Provider, MD    Family History No family history on file.  Social History Social History  Substance Use Topics  . Smoking status: Former Smoker    Packs/day: 0.03    Years: 2.00    Types: Cigarettes  . Smokeless tobacco: Never Used  . Alcohol use No     Allergies   Robitussin [guaifenesin]   Review of Systems Review of Systems ROS: Statement: All systems negative except as marked or noted in the HPI; Constitutional: Negative for fever and chills. ; ; Eyes: Negative for eye pain, redness and discharge. ; ; ENMT: Negative for ear pain, hoarseness, sore throat.  +nasal congestion, sinus pressure and rhinorrhea. ; ; Cardiovascular: Negative for chest pain, palpitations, diaphoresis, dyspnea and peripheral edema. ; ; Respiratory: +cough. Negative for wheezing and stridor. ; ; Gastrointestinal: Negative for nausea, vomiting, diarrhea, abdominal pain, blood in stool, hematemesis, jaundice and rectal bleeding. . ; ; Genitourinary: Negative for dysuria, flank pain and hematuria. ; ; Musculoskeletal: Negative for back pain and neck pain. Negative for swelling and trauma.; ; Skin: Negative for pruritus, rash, abrasions, blisters, bruising and skin lesion.; ; Neuro: Negative for headache, lightheadedness and neck stiffness. Negative for weakness, altered level of consciousness, altered mental status, extremity weakness, paresthesias, involuntary movement, seizure and syncope.       Physical Exam Updated Vital Signs BP 155/81 (BP Location: Right Arm)   Pulse 70   Temp 101.7 F (38.7 C) (Oral)   Resp 18   Ht 5\' 9"  (1.753 m)   Wt 231 lb (104.8 kg)   SpO2 97%   BMI 34.11 kg/m   Physical Exam 1405: Physical examination:  Nursing notes reviewed; Vital signs and O2 SAT reviewed; +febrile.;; Constitutional: Well developed, Well nourished, Well hydrated, In no acute distress; Head:  Normocephalic, atraumatic; Eyes: EOMI, PERRL, No scleral icterus; ENMT: TM's clear bilat. +edemetous nasal turbinates bilat with clear rhinorrhea. Mouth and pharynx without lesions. No tonsillar exudates. No intra-oral edema. No submandibular or sublingual edema. No hoarse voice,  no drooling, no stridor. No pain with manipulation of larynx. No trismus. Mouth and pharynx normal, Mucous membranes moist; Neck: Supple, Full range of motion, No lymphadenopathy; Cardiovascular: Regular rate and rhythm, No gallop; Respiratory: Breath sounds clear & equal bilaterally, No wheezes.  Speaking full sentences with ease, Normal respiratory effort/excursion; Chest: Nontender, Movement normal; Abdomen: Soft,  Nontender, Nondistended, Normal bowel sounds; Genitourinary: No CVA tenderness; Extremities: Pulses normal, No tenderness, No edema, No calf edema or asymmetry.; Neuro: AA&Ox3, Major CN grossly intact.  Speech clear. No gross focal motor or sensory deficits in extremities. Climbs on and off stretcher easily by himself. Gait steady.; Skin: Color normal, Warm, Dry.   ED Treatments / Results  Labs (all labs ordered are listed, but only abnormal results are displayed)   EKG  EKG Interpretation None       Radiology   Procedures Procedures (including critical care time)  Medications Ordered in ED Medications  acetaminophen (TYLENOL) tablet 650 mg (650 mg Oral Given 11/11/16 1316)     Initial Impression / Assessment and Plan / ED Course  I have reviewed the triage vital signs and the nursing notes.  Pertinent labs & imaging results that were available during my care of the patient were reviewed by me and considered in my medical decision making (see chart for details).  MDM Reviewed: previous chart, nursing note and vitals   1400:  Pt insistent he "doesn't want any medication that can cause side effects." Long d/w pt that there was no guarantee any medication, including the tylenol he was just given, would not cause "side effects." Pt agitated, stating "I'll be back if I get side effects!" Pt cannot clarify further what he means by "side effects" and I again reiterated that no medication is without the possibility of side effects. 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 Dx d/w pt.  Questions answered.  Verb understanding, agreeable to d/c home with outpt f/u.      Final Clinical Impressions(s) / ED Diagnoses   Final diagnoses:  None    New Prescriptions New Prescriptions   No medications on file     Samuel JesterKathleen Malijah Lietz, DO 11/16/16 1705

## 2016-12-20 IMAGING — DX DG CHEST 2V
2 series · 2 of 2 positions shown · non-contrast
Comparison: Prior chest x-ray 09/05/2014

CLINICAL DATA: 29-year-old male with central chest tightness and
left-sided chest pain with deep inspiration

EXAM:
CHEST  2 VIEW

[chest pa]
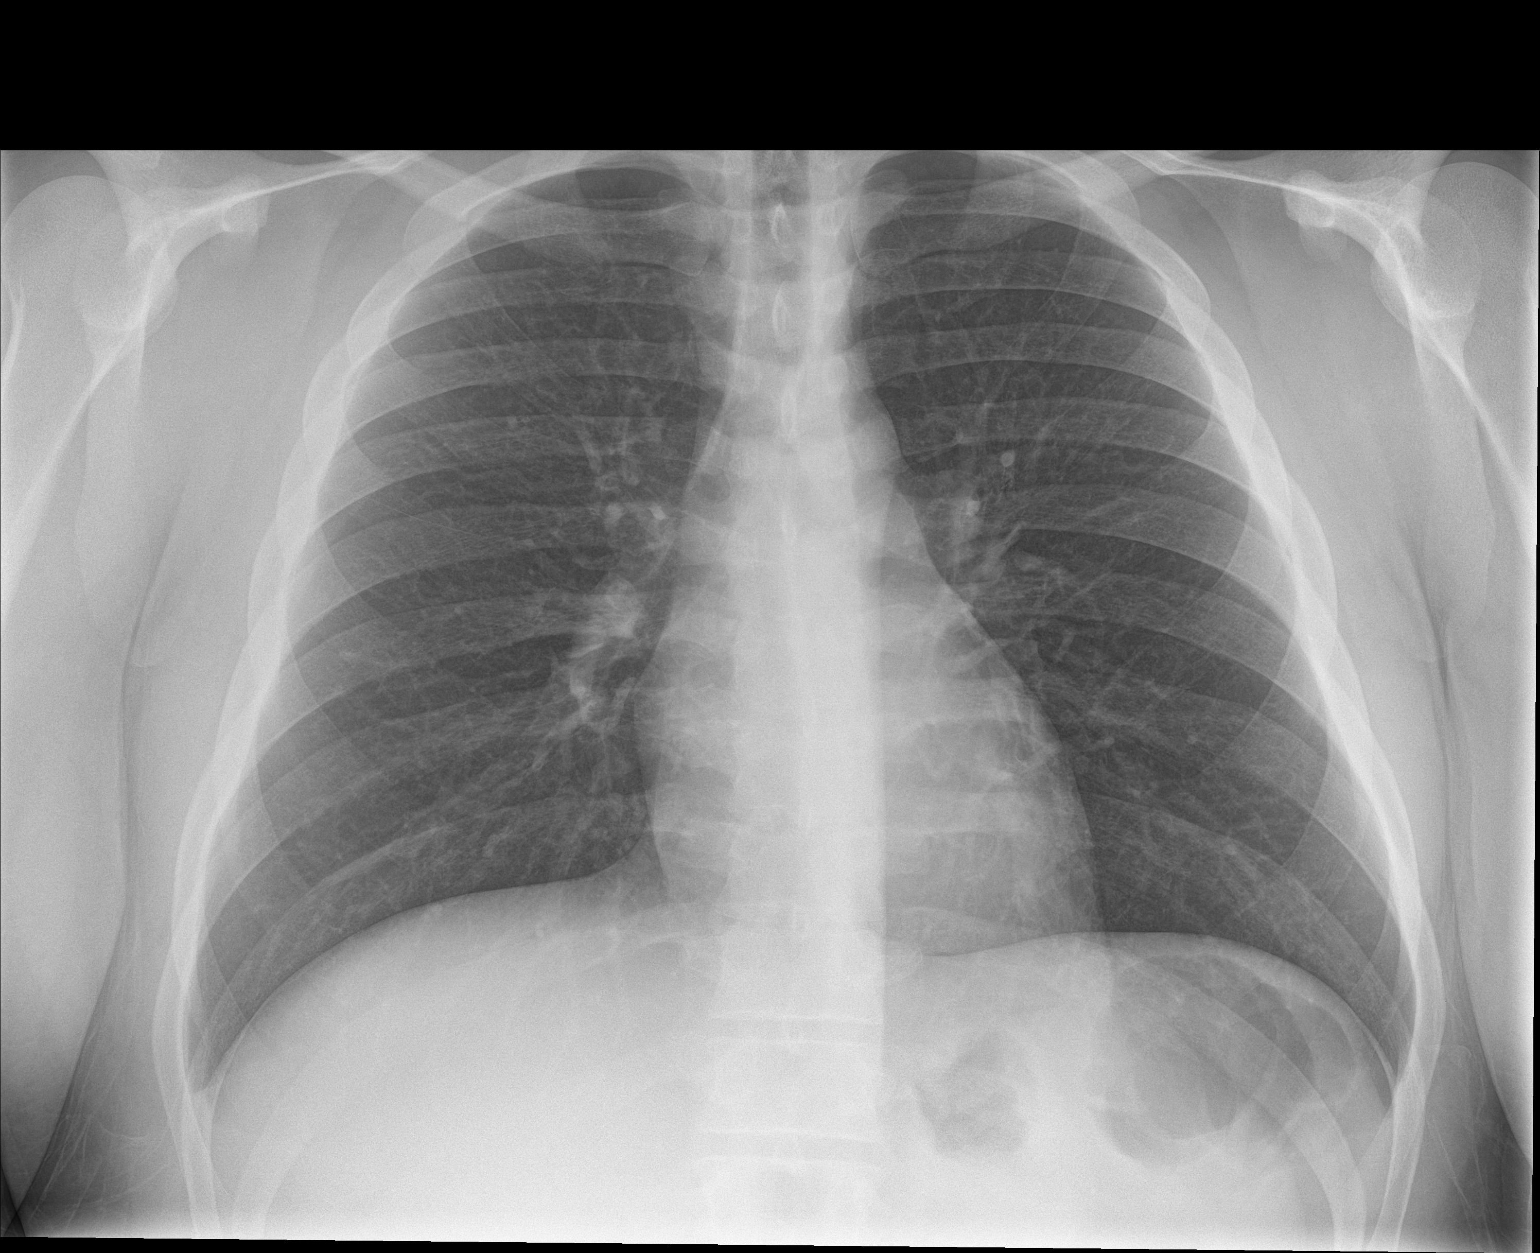

[chest lat]
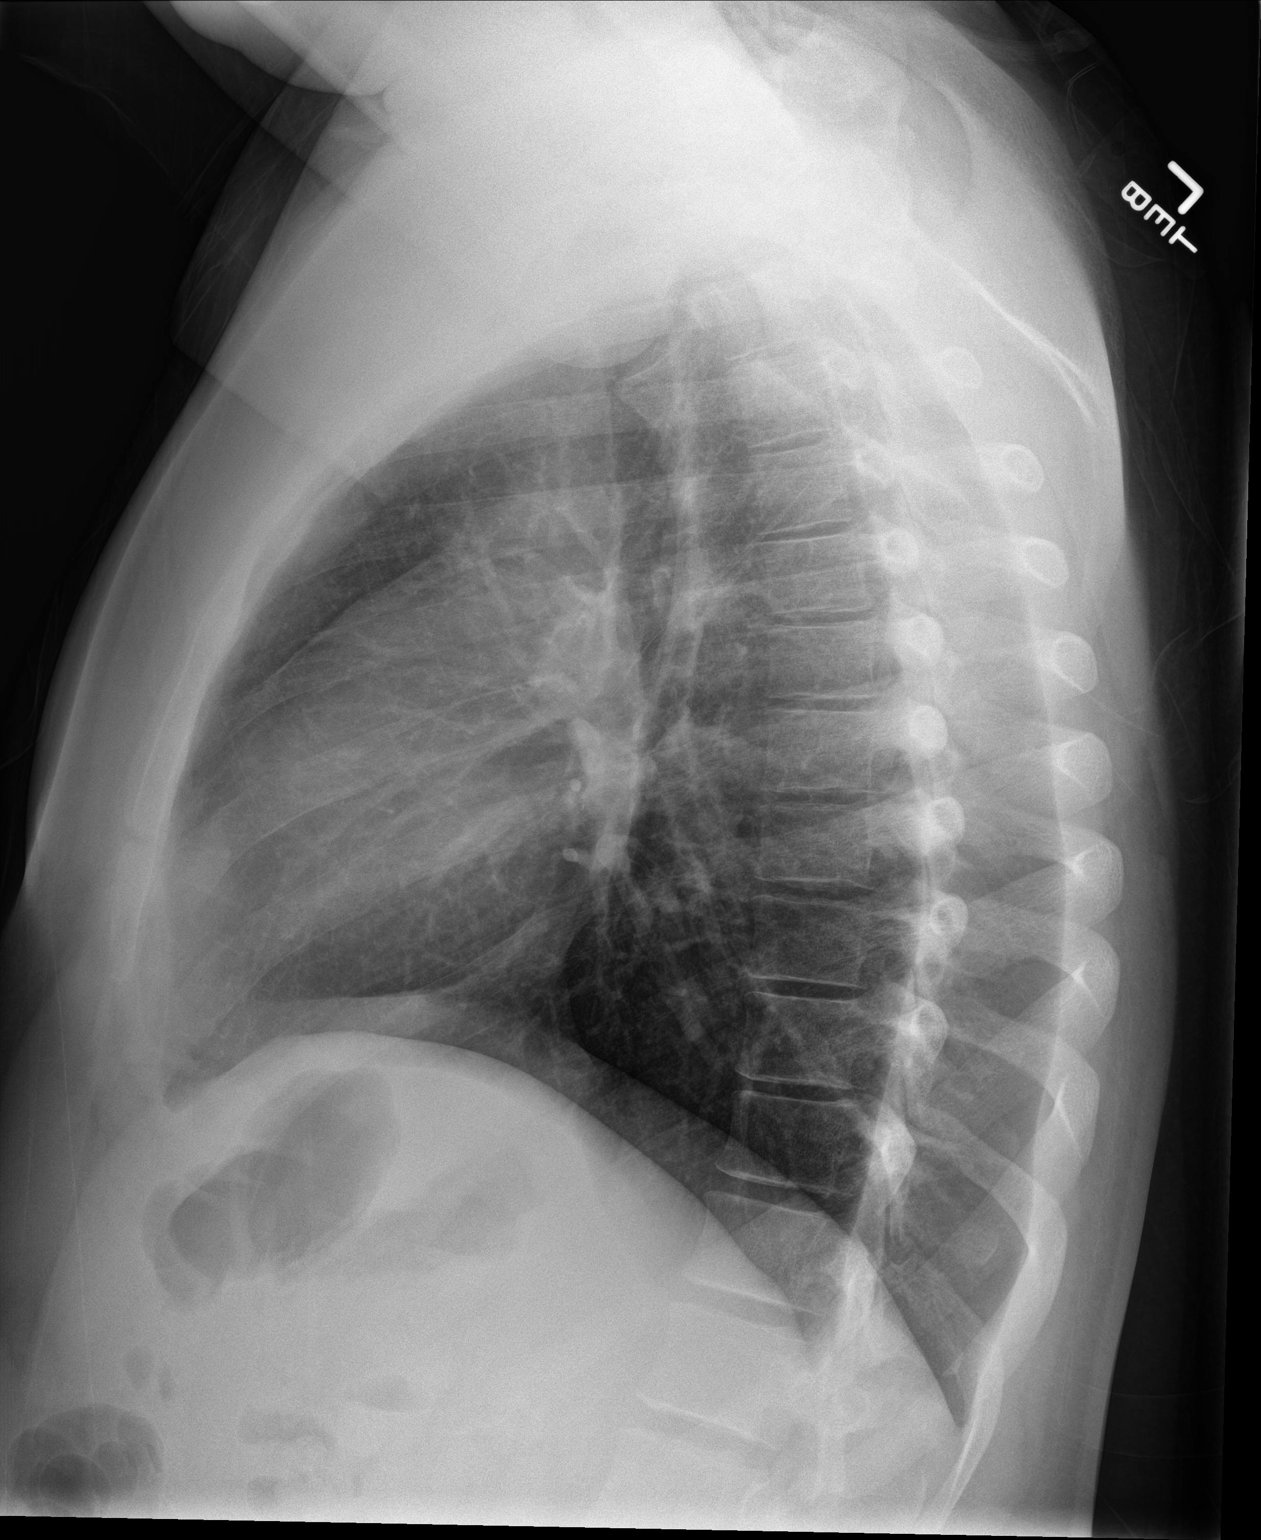

[2 of 2 positions shown; findings below may reference images not displayed]

FINDINGS: The lungs are clear and negative for focal airspace consolidation,
pulmonary edema or suspicious pulmonary nodule. No pleural effusion
or pneumothorax. Cardiac and mediastinal contours are within normal
limits. No acute fracture or lytic or blastic osseous lesions. The
visualized upper abdominal bowel gas pattern is unremarkable.
IMPRESSION: Negative chest x-ray.

## 2018-02-16 ENCOUNTER — Other Ambulatory Visit: Payer: Self-pay

## 2018-02-16 ENCOUNTER — Emergency Department (HOSPITAL_COMMUNITY)
Admission: EM | Admit: 2018-02-16 | Discharge: 2018-02-16 | Disposition: A | Discharge status: Home / Self Care | Payer: Self-pay | Attending: Emergency Medicine | Admitting: Emergency Medicine

## 2018-02-16 ENCOUNTER — Encounter (HOSPITAL_COMMUNITY): Payer: Self-pay | Admitting: Emergency Medicine

## 2018-02-16 DIAGNOSIS — Z79899 Other long term (current) drug therapy: Secondary | ICD-10-CM | POA: Insufficient documentation

## 2018-02-16 DIAGNOSIS — I10 Essential (primary) hypertension: Secondary | ICD-10-CM | POA: Insufficient documentation

## 2018-02-16 DIAGNOSIS — N342 Other urethritis: Secondary | ICD-10-CM | POA: Insufficient documentation

## 2018-02-16 DIAGNOSIS — Z87891 Personal history of nicotine dependence: Secondary | ICD-10-CM | POA: Insufficient documentation

## 2018-02-16 DIAGNOSIS — J45909 Unspecified asthma, uncomplicated: Secondary | ICD-10-CM | POA: Insufficient documentation

## 2018-02-16 LAB — URINALYSIS, ROUTINE W REFLEX MICROSCOPIC
Bacteria, UA: NONE SEEN
Bilirubin Urine: NEGATIVE
Glucose, UA: NEGATIVE mg/dL
Hgb urine dipstick: NEGATIVE
Ketones, ur: NEGATIVE mg/dL
Nitrite: NEGATIVE
PROTEIN: NEGATIVE mg/dL
Specific Gravity, Urine: 1.027 (ref 1.005–1.030)
pH: 5 (ref 5.0–8.0)

## 2018-02-16 MED ORDER — LIDOCAINE HCL (PF) 1 % IJ SOLN
INTRAMUSCULAR | Status: AC
  Administered 2018-02-16: 2 mL
  Filled 2018-02-16: qty 2

## 2018-02-16 MED ORDER — AZITHROMYCIN 250 MG PO TABS
1000.0000 mg | ORAL_TABLET | Freq: Once | ORAL | Status: AC
  Administered 2018-02-16: 1000 mg via ORAL
  Filled 2018-02-16: qty 4

## 2018-02-16 MED ORDER — CEFTRIAXONE SODIUM 250 MG IJ SOLR
250.0000 mg | Freq: Once | INTRAMUSCULAR | Status: AC
  Administered 2018-02-16: 250 mg via INTRAMUSCULAR
  Filled 2018-02-16: qty 250

## 2018-02-16 NOTE — ED Provider Notes (Signed)
[facility] Provider Note   CSN: [id number] Arrival date & time: 02/16/18  1138     History   Chief Complaint Chief Complaint  Patient presents with  . SEXUALLY TRANSMITTED DISEASE    HPI Robert Drake is a 32 y.o. male.  HPI   Robert [facility] is a 32 y.o. male who presents to the Emergency Department complaining of  Burning with urination and yellow drainage from his penis.  Admits to having unprotected sex with a new partner 4 days ago.  Developed slight burning, stinging pain 2 days ago and noticed the discharge this morning.  Denies abdominal pain, fever, hematuria, flank pain, testicle pain or swelling, rash or genital lesions.  Hx of gonorrhea one year ago and states current symptoms feel similar.     Past Medical History:  Diagnosis Date  . Asthma   . Bronchitis   . Bronchitis   . Hypertension     There are no active problems to display for this patient.   History reviewed. No pertinent surgical history.      Home Medications    Prior to Admission medications   Medication Sig Start Date End Date Taking? Authorizing Provider  albuterol (PROVENTIL HFA;VENTOLIN HFA) 108 (90 BASE) MCG/ACT inhaler Inhale 2 puffs into the lungs every 4 (four) hours as needed. 09/05/14   Devoria AlbeKnapp, Iva, MD  hydrochlorothiazide (HYDRODIURIL) 25 MG tablet Take 1 tablet (25 mg total) by mouth daily. 01/20/16   Elson AreasSofia, Leslie K, PA-C  ibuprofen (ADVIL,MOTRIN) 200 MG tablet Take 400 mg by mouth every 6 (six) hours as needed for pain.    [provider]  mometasone-formoterol (DULERA) 200-5 MCG/ACT AERO Inhale 2 puffs into the lungs 2 (two) times daily.    [provider]  oseltamivir (TAMIFLU) 75 MG capsule Take 1 capsule (75 mg total) by mouth every 12 (twelve) hours. 11/11/16   Samuel JesterMcManus, Kathleen, DO    Family History History reviewed. No pertinent family history.  Social History Social History   Tobacco Use  . Smoking status: Former Smoker   Packs/day: 0.03    Years: 2.00    Pack years: 0.06    Types: Cigarettes  . Smokeless tobacco: Never Used  Substance Use Topics  . Alcohol use: No  . Drug use: Yes    Types: Marijuana    Comment: occasionally     Allergies   Robitussin [guaifenesin]   Review of Systems Review of Systems  Constitutional: Negative for activity change, appetite change, chills and fever.  Respiratory: Negative for chest tightness and shortness of breath.   Gastrointestinal: Negative for abdominal pain, nausea and vomiting.  Genitourinary: Positive for discharge and dysuria. Negative for decreased urine volume, difficulty urinating, flank pain, frequency, hematuria, penile pain, penile swelling, scrotal swelling, testicular pain and urgency.  Musculoskeletal: Negative for back pain.  Skin: Negative for rash.  Neurological: Negative for dizziness, weakness and numbness.  Hematological: Negative for adenopathy.  Psychiatric/Behavioral: Negative for confusion.  All other systems reviewed and are negative.    Physical Exam Updated Vital Signs BP (!) 153/102   Pulse 62   Temp 98.5 F (36.9 C) (Oral)   Resp 16   Ht 5\' 6"  (1.676 m)   Wt 95.3 kg (210 lb)   SpO2 100%   BMI 33.89 kg/m   Physical Exam  Constitutional: He is oriented to person, place, and time. He appears well-nourished. No distress.  HENT:  Head: Atraumatic.  Mouth/Throat: Oropharynx is clear and moist.  Cardiovascular: Normal rate and regular rhythm.  Pulmonary/Chest: Effort normal and breath sounds normal. No respiratory distress.  Abdominal: Soft. He exhibits no distension. There is no tenderness.  Genitourinary: Testes normal. Cremasteric reflex is present. Right testis shows no mass, no swelling and no tenderness. Left testis shows no mass, no swelling and no tenderness. Circumcised. No penile erythema or penile tenderness. No discharge found.  Genitourinary Comments: Small amt of urethral discharge on exam.  No genital  lesions or rash.  Testes are nontender, no masses  Musculoskeletal: Normal range of motion.  Neurological: He is alert and oriented to person, place, and time.  Skin: Skin is warm. No rash noted.  Nursing note and vitals reviewed.    ED Treatments / Results  Labs (all labs ordered are listed, but only abnormal results are displayed) Labs Reviewed  URINALYSIS, ROUTINE W REFLEX MICROSCOPIC - Abnormal; Notable for the following components:      Result Value   APPearance HAZY (*)    Leukocytes, UA MODERATE (*)    WBC, UA >50 (*)    All other components within normal limits  URINE CULTURE  GC/CHLAMYDIA PROBE AMP ([place]) NOT AT [facility]    EKG None  Radiology No results found.  Procedures Procedures (including critical care time)  Medications Ordered in ED Medications  azithromycin (ZITHROMAX) tablet 1,000 mg (1,000 mg Oral Given 02/16/18 1310)  cefTRIAXone (ROCEPHIN) injection 250 mg (250 mg Intramuscular Given 02/16/18 1309)  lidocaine (PF) (XYLOCAINE) 1 % injection (2 mLs  Given 02/16/18 1310)     Initial Impression / Assessment and Plan / ED Course  I have reviewed the triage vital signs and the nursing notes.  Pertinent labs & imaging results that were available during my care of the patient were reviewed by me and considered in my medical decision making (see chart for details).     Patient with history of unprotected intercourse 4 days ago and urethral discharge.  He is otherwise well-appearing without abdominal pain, fever, or back pain.  No genital lesions,  swelling or pain of the testicles.  Patient treated here with IM Rocephin and p.o. Zithromax.  Cultures are pending.  He agrees to this treatment plan and follow-up with the health department.  Final Clinical Impressions(s) / ED Diagnoses   Final diagnoses:  Urethritis    ED Discharge Orders    None       Pauline Aus, PA-C 02/16/18 1446    Eber Hong, MD 02/18/18 [phone]

## 2018-02-16 NOTE — Discharge Instructions (Addendum)
Your cultures are pending.  You have been treated with medication for gonorrhea and chlamydia.  You will be notified if cultures are positive.  You may follow-up with the health department if needed.

## 2018-02-16 NOTE — ED Triage Notes (Signed)
Pt states has white discharge coming from penis since this am.

## 2018-02-17 LAB — GC/CHLAMYDIA PROBE AMP (~~LOC~~) NOT AT ARMC
Chlamydia: NEGATIVE
NEISSERIA GONORRHEA: POSITIVE — AB

## 2018-07-06 ENCOUNTER — Emergency Department (HOSPITAL_COMMUNITY)
Admission: EM | Admit: 2018-07-06 | Discharge: 2018-07-06 | Disposition: A | Discharge status: Home / Self Care | Payer: Self-pay | Attending: Emergency Medicine | Admitting: Emergency Medicine

## 2018-07-06 ENCOUNTER — Other Ambulatory Visit: Payer: Self-pay

## 2018-07-06 ENCOUNTER — Encounter (HOSPITAL_COMMUNITY): Payer: Self-pay | Admitting: Emergency Medicine

## 2018-07-06 DIAGNOSIS — Z202 Contact with and (suspected) exposure to infections with a predominantly sexual mode of transmission: Secondary | ICD-10-CM | POA: Insufficient documentation

## 2018-07-06 DIAGNOSIS — I10 Essential (primary) hypertension: Secondary | ICD-10-CM | POA: Insufficient documentation

## 2018-07-06 DIAGNOSIS — J45909 Unspecified asthma, uncomplicated: Secondary | ICD-10-CM | POA: Insufficient documentation

## 2018-07-06 DIAGNOSIS — R300 Dysuria: Secondary | ICD-10-CM | POA: Insufficient documentation

## 2018-07-06 DIAGNOSIS — Z79899 Other long term (current) drug therapy: Secondary | ICD-10-CM | POA: Insufficient documentation

## 2018-07-06 DIAGNOSIS — Z87891 Personal history of nicotine dependence: Secondary | ICD-10-CM | POA: Insufficient documentation

## 2018-07-06 LAB — URINALYSIS, ROUTINE W REFLEX MICROSCOPIC
Bilirubin Urine: NEGATIVE
GLUCOSE, UA: NEGATIVE mg/dL
HGB URINE DIPSTICK: NEGATIVE
Ketones, ur: NEGATIVE mg/dL
LEUKOCYTES UA: NEGATIVE
Nitrite: NEGATIVE
PH: 7 (ref 5.0–8.0)
PROTEIN: NEGATIVE mg/dL
Specific Gravity, Urine: 1.019 (ref 1.005–1.030)

## 2018-07-06 MED ORDER — CEFTRIAXONE SODIUM 250 MG IJ SOLR
250.0000 mg | Freq: Once | INTRAMUSCULAR | Status: AC
  Administered 2018-07-06: 250 mg via INTRAMUSCULAR
  Filled 2018-07-06: qty 250

## 2018-07-06 MED ORDER — AZITHROMYCIN 250 MG PO TABS
1000.0000 mg | ORAL_TABLET | Freq: Once | ORAL | Status: AC
  Administered 2018-07-06: 1000 mg via ORAL
  Filled 2018-07-06: qty 4

## 2018-07-06 MED ORDER — LIDOCAINE HCL (PF) 1 % IJ SOLN
INTRAMUSCULAR | Status: AC
  Administered 2018-07-06: 0.9 mL
  Filled 2018-07-06: qty 5

## 2018-07-06 NOTE — ED Provider Notes (Signed)
[facility] EMERGENCY DEPARTMENT Provider Note   CSN: [id number] Arrival date & time: 07/06/18  1524     History   Chief Complaint Chief Complaint  Patient presents with  . Dysuria    HPI [facility] is a 32 y.o. male.  Patient states that his girlfriend came home from the doctor and had given her instructions to give to the patient to take 2000 mg of Flagyl.  The patient says that he was frightened.  He also states he was being accused of having an STD so he came to the emergency department for evaluation.  He says that when he went to the health department they did not have an appointment for him for 2 weeks.  He  The history is provided by the patient.  Dysuria   This is a new problem. The current episode started 2 days ago. The problem occurs intermittently. The problem has not changed since onset.The quality of the pain is described as burning. The pain is mild. There has been no fever. He is sexually active. Pertinent negatives include no chills, no nausea, no vomiting, no discharge, no frequency, no hematuria and no urgency. He has tried nothing for the symptoms.    Past Medical History:  Diagnosis Date  . Asthma   . Bronchitis   . Bronchitis   . Hypertension     There are no active problems to display for this patient.   History reviewed. No pertinent surgical history.      Home Medications    Prior to Admission medications   Medication Sig Start Date End Date Taking? Authorizing Provider  albuterol (PROVENTIL HFA;VENTOLIN HFA) 108 (90 BASE) MCG/ACT inhaler Inhale 2 puffs into the lungs every 4 (four) hours as needed. 09/05/14   Devoria Albe, MD  hydrochlorothiazide (HYDRODIURIL) 25 MG tablet Take 1 tablet (25 mg total) by mouth daily. 01/20/16   Elson Areas, PA-C  ibuprofen (ADVIL,MOTRIN) 200 MG tablet Take 400 mg by mouth every 6 (six) hours as needed for pain.    [provider]  mometasone-formoterol (DULERA) 200-5 MCG/ACT AERO Inhale 2 puffs  into the lungs 2 (two) times daily.    [provider]  oseltamivir (TAMIFLU) 75 MG capsule Take 1 capsule (75 mg total) by mouth every 12 (twelve) hours. 11/11/16   Samuel Jester, DO    Family History No family history on file.  Social History Social History   Tobacco Use  . Smoking status: Former Smoker    Packs/day: 0.03    Years: 2.00    Pack years: 0.06    Types: Cigarettes  . Smokeless tobacco: Never Used  Substance Use Topics  . Alcohol use: No  . Drug use: Yes    Types: Marijuana    Comment: occasionally     Allergies   Robitussin [guaifenesin]   Review of Systems Review of Systems  Constitutional: Negative for chills.  Gastrointestinal: Negative for nausea and vomiting.  Genitourinary: Positive for dysuria. Negative for frequency, hematuria and urgency.     Physical Exam Updated Vital Signs BP (!) 164/92   Pulse 67   Temp 98.5 F (36.9 C) (Oral)   Resp 18   Ht 5\' 8"  (1.727 m)   Wt 96.6 kg   SpO2 99%   BMI 32.39 kg/m   Physical Exam  Constitutional: He is oriented to person, place, and time. He appears well-developed and well-nourished.  Non-toxic appearance.  HENT:  Head: Normocephalic.  Right Ear: Tympanic membrane and external  ear normal.  Left Ear: Tympanic membrane and external ear normal.  Eyes: Pupils are equal, round, and reactive to light. EOM and lids are normal.  Neck: Normal range of motion. Neck supple. Carotid bruit is not present.  Cardiovascular: Normal rate, regular rhythm, normal heart sounds, intact distal pulses and normal pulses.  Pulmonary/Chest: Breath sounds normal. No respiratory distress.  Abdominal: Soft. Bowel sounds are normal. There is no tenderness. There is no guarding.  Genitourinary: Testes normal and penis normal. Right testis shows no tenderness. Left testis shows no tenderness. Circumcised. No hypospadias, penile erythema or penile tenderness. No discharge found.  Musculoskeletal: Normal range of  motion.  Lymphadenopathy:       Head (right side): No submandibular adenopathy present.       Head (left side): No submandibular adenopathy present.    He has no cervical adenopathy.  Neurological: He is alert and oriented to person, place, and time. He has normal strength. No cranial nerve deficit or sensory deficit.  Skin: Skin is warm and dry.  Psychiatric: He has a normal mood and affect. His speech is normal.  Nursing note and vitals reviewed.    ED Treatments / Results  Labs (all labs ordered are listed, but only abnormal results are displayed) Labs Reviewed  URINALYSIS, ROUTINE W REFLEX MICROSCOPIC  HIV ANTIBODY (ROUTINE TESTING W REFLEX)  RPR  GC/CHLAMYDIA PROBE AMP () NOT AT [facility]    EKG None  Radiology No results found.  Procedures Procedures (including critical care time)  Medications Ordered in ED Medications  azithromycin (ZITHROMAX) tablet 1,000 mg (has no administration in time range)  cefTRIAXone (ROCEPHIN) injection 250 mg (has no administration in time range)     Initial Impression / Assessment and Plan / ED Course  I have reviewed the triage vital signs and the nursing notes.  Pertinent labs & imaging results that were available during my care of the patient were reviewed by me and considered in my medical decision making (see chart for details).      Final Clinical Impressions(s) / ED Diagnoses  MDM  Vital signs reviewed.  Patient was given instructions to take 2000 mg of Flagyl.  He was not told what the diagnosis was for his partner.  The patient presents to the emergency department for additional testing and evaluation.  GC chlamydia culture sent to the lab.  HIV and RPR test obtained.  Patient treated in the emergency department with intramuscular Rocephin and oral Zithromax.  I have advised the patient to take the Flagyl as suggested by the patient's partners physician.  I have asked him to refrain from all sexual activity over  the next 7 days.  I discussed with him the importance of safe sex.   Final diagnoses:  Dysuria  STD exposure    ED Discharge Orders    None       Ivery Quale, PA-C 07/06/18 1645    Loren Racer, MD 07/11/18 1537

## 2018-07-06 NOTE — Discharge Instructions (Addendum)
Your blood pressure is slightly elevated.  Please have this rechecked soon.  Please take the Flagyl medication as prescribed by your partners physician.  You were treated in the emergency department with intramuscular Rocephin and oral Zithromax to cover any possible gonorrhea or chlamydia.  A culture has been sent to the lab.  If there is any abnormality, someone from the flow managers office will call you with any additional or new instructions.  Please refrain from all sexual activity over the next 7 days.  It is important that you practice safe sex.

## 2018-07-06 NOTE — ED Triage Notes (Signed)
Pt c/o burning with urination x 2 days. Pt given prescription for Flagyl that he has not gotten filled. Pt stated girlfriend is currently being treated for STD.

## 2018-07-07 LAB — GC/CHLAMYDIA PROBE AMP (~~LOC~~) NOT AT ARMC
Chlamydia: NEGATIVE
Neisseria Gonorrhea: NEGATIVE

## 2018-07-07 LAB — RPR: RPR Ser Ql: NONREACTIVE

## 2018-07-07 LAB — HIV ANTIBODY (ROUTINE TESTING W REFLEX): HIV Screen 4th Generation wRfx: NONREACTIVE

## 2018-08-24 ENCOUNTER — Encounter (HOSPITAL_COMMUNITY): Payer: Self-pay | Admitting: Emergency Medicine

## 2018-08-24 ENCOUNTER — Emergency Department (HOSPITAL_COMMUNITY)
Admission: EM | Admit: 2018-08-24 | Discharge: 2018-08-24 | Disposition: A | Discharge status: Home / Self Care | Payer: Self-pay | Attending: Emergency Medicine | Admitting: Emergency Medicine

## 2018-08-24 ENCOUNTER — Other Ambulatory Visit: Payer: Self-pay

## 2018-08-24 DIAGNOSIS — F17210 Nicotine dependence, cigarettes, uncomplicated: Secondary | ICD-10-CM | POA: Insufficient documentation

## 2018-08-24 DIAGNOSIS — J45909 Unspecified asthma, uncomplicated: Secondary | ICD-10-CM | POA: Insufficient documentation

## 2018-08-24 DIAGNOSIS — Z202 Contact with and (suspected) exposure to infections with a predominantly sexual mode of transmission: Secondary | ICD-10-CM | POA: Insufficient documentation

## 2018-08-24 DIAGNOSIS — I10 Essential (primary) hypertension: Secondary | ICD-10-CM | POA: Insufficient documentation

## 2018-08-24 DIAGNOSIS — R369 Urethral discharge, unspecified: Secondary | ICD-10-CM | POA: Insufficient documentation

## 2018-08-24 LAB — URINALYSIS, ROUTINE W REFLEX MICROSCOPIC
BILIRUBIN URINE: NEGATIVE
Glucose, UA: NEGATIVE mg/dL
HGB URINE DIPSTICK: NEGATIVE
Ketones, ur: NEGATIVE mg/dL
Leukocytes, UA: NEGATIVE
NITRITE: NEGATIVE
PROTEIN: NEGATIVE mg/dL
Specific Gravity, Urine: 1.026 (ref 1.005–1.030)
pH: 5 (ref 5.0–8.0)

## 2018-08-24 MED ORDER — CEFTRIAXONE SODIUM 250 MG IJ SOLR
250.0000 mg | Freq: Once | INTRAMUSCULAR | Status: AC
Start: 2018-08-24 — End: 2018-08-24
  Administered 2018-08-24: 250 mg via INTRAMUSCULAR
  Filled 2018-08-24: qty 250

## 2018-08-24 MED ORDER — LIDOCAINE HCL (PF) 1 % IJ SOLN
INTRAMUSCULAR | Status: AC
  Filled 2018-08-24: qty 2

## 2018-08-24 MED ORDER — AZITHROMYCIN 250 MG PO TABS
1000.0000 mg | ORAL_TABLET | Freq: Once | ORAL | Status: AC
  Administered 2018-08-24: 1000 mg via ORAL
  Filled 2018-08-24: qty 4

## 2018-08-24 NOTE — Discharge Instructions (Addendum)
You have been treated today with medications for gonorrhea and chlamydia.  Your cultures are still pending.  You will be notified of any positive results.  Follow-up with the health department if needed.

## 2018-08-24 NOTE — ED Provider Notes (Signed)
[facility] EMERGENCY DEPARTMENT Provider Note   CSN: [id number] Arrival date & time: 08/24/18  4098     History   Chief Complaint Chief Complaint  Patient presents with  . Exposure to STD    HPI [facility] is a 32 y.o. male.  HPI   [facility] is a 32 y.o. male who presents to the Emergency Department requesting evaluation for a penile discharge and burning with urination.  Symptoms have been present for 2 days.  He admits to recent unprotected sex with 2 different sexual partners within 2 weeks.  He states that one partner notified him that she tested positive for gonorrhea and has been treated.  He describes having an intermittent thin gray charge from his penis this morning, otherwise he denies any symptoms.  No urinary frequency, flank pain, fever, chills, or abdominal pain.  He denies genital lesions.  He states he has been treated for STDs in the past.  Past Medical History:  Diagnosis Date  . Asthma   . Bronchitis   . Bronchitis   . Hypertension     There are no active problems to display for this patient.   History reviewed. No pertinent surgical history.    Home Medications    Prior to Admission medications   Medication Sig Start Date End Date Taking? Authorizing Provider  albuterol (PROVENTIL HFA;VENTOLIN HFA) 108 (90 BASE) MCG/ACT inhaler Inhale 2 puffs into the lungs every 4 (four) hours as needed. 09/05/14   Devoria Albe, MD  hydrochlorothiazide (HYDRODIURIL) 25 MG tablet Take 1 tablet (25 mg total) by mouth daily. 01/20/16   Elson Areas, PA-C  ibuprofen (ADVIL,MOTRIN) 200 MG tablet Take 400 mg by mouth every 6 (six) hours as needed for pain.    [provider]  mometasone-formoterol (DULERA) 200-5 MCG/ACT AERO Inhale 2 puffs into the lungs 2 (two) times daily.    [provider]  oseltamivir (TAMIFLU) 75 MG capsule Take 1 capsule (75 mg total) by mouth every 12 (twelve) hours. 11/11/16   Samuel Jester, DO    Family  History No family history on file.  Social History Social History   Tobacco Use  . Smoking status: Former Smoker    Packs/day: 0.03    Years: 2.00    Pack years: 0.06    Types: Cigarettes  . Smokeless tobacco: Never Used  Substance Use Topics  . Alcohol use: No  . Drug use: Yes    Types: Marijuana    Comment: occasionally     Allergies   Robitussin [guaifenesin]   Review of Systems Review of Systems  Constitutional: Negative for activity change, appetite change, chills and fever.  Respiratory: Negative for chest tightness and shortness of breath.   Gastrointestinal: Negative for abdominal pain, nausea and vomiting.  Genitourinary: Positive for discharge. Negative for decreased urine volume, difficulty urinating, dysuria, flank pain, frequency, genital sores, hematuria, penile pain, penile swelling, scrotal swelling and testicular pain.  Musculoskeletal: Negative for back pain.  Skin: Negative for rash.  Neurological: Negative for dizziness, weakness and numbness.  Hematological: Negative for adenopathy.  Psychiatric/Behavioral: Negative for confusion.     Physical Exam Updated Vital Signs BP (!) 156/87 (BP Location: Right Arm)   Pulse 74   Temp 98.5 F (36.9 C) (Oral)   Resp 16   Ht 5\' 8"  (1.727 m)   Wt 108 kg   SpO2 99%   BMI 36.19 kg/m   Physical Exam  Constitutional: He appears well-developed and well-nourished. No  distress.  HENT:  Mouth/Throat: Oropharynx is clear and moist.  Neck: Normal range of motion.  Cardiovascular: Normal rate, regular rhythm and normal heart sounds.  Pulmonary/Chest: Effort normal and breath sounds normal. No respiratory distress.  Abdominal: Soft. He exhibits no distension and no mass. There is no tenderness. There is no guarding. Hernia confirmed negative in the right inguinal area and confirmed negative in the left inguinal area.  Genitourinary: Testes normal and penis normal. Cremasteric reflex is present. Right testis  shows no mass, no swelling and no tenderness. Left testis shows no mass, no swelling and no tenderness. Circumcised. No penile tenderness. No discharge found.  Genitourinary Comments: Exam chaperoned.  No genital lesions or penile discharge noted.  Musculoskeletal: Normal range of motion.  Lymphadenopathy:    He has no cervical adenopathy.  Neurological: He is alert. No sensory deficit.  Skin: Skin is warm. Capillary refill takes less than 2 seconds. No rash noted.  Psychiatric: He has a normal mood and affect.  Nursing note and vitals reviewed.    ED Treatments / Results  Labs (all labs ordered are listed, but only abnormal results are displayed) Labs Reviewed  URINALYSIS, ROUTINE W REFLEX MICROSCOPIC  GC/CHLAMYDIA PROBE AMP ([place]) NOT AT [facility]    EKG None  Radiology No results found.  Procedures Procedures (including critical care time)  Medications Ordered in ED Medications  azithromycin (ZITHROMAX) tablet 1,000 mg (has no administration in time range)  cefTRIAXone (ROCEPHIN) injection 250 mg (has no administration in time range)     Initial Impression / Assessment and Plan / ED Course  I have reviewed the triage vital signs and the nursing notes.  Pertinent labs & imaging results that were available during my care of the patient were reviewed by me and considered in my medical decision making (see chart for details).     Patient with history of previous STDs and admits to recent unprotected intercourse with 2 different partners.  No obvious penile discharge on exam, but I will treat with Rocephin and Zithromax.  Patient seen here in October for similar symptoms with negative GC chlamydia RPR and HIV testing.  cultures today are pending, urinalysis is reassuring.  Patient agrees to treatment plan, he appears appropriate for discharge home and referral information given for local health department.  Final Clinical Impressions(s) / ED Diagnoses   Final diagnoses:   Possible exposure to STD    ED Discharge Orders    None       Pauline Ausriplett, Brendalyn Vallely, PA-C 08/24/18 [phone]    Benjiman CorePickering, Nathan, MD 08/24/18 1529

## 2018-08-24 NOTE — ED Triage Notes (Signed)
Patient complains of green discharge and slight burning sensation x 2 days. Patient states he has had two sexual partners in past 2 weeks.

## 2018-08-25 LAB — GC/CHLAMYDIA PROBE AMP (~~LOC~~) NOT AT ARMC
Chlamydia: NEGATIVE
NEISSERIA GONORRHEA: NEGATIVE

## 2019-06-08 ENCOUNTER — Emergency Department (HOSPITAL_COMMUNITY)
Admission: EM | Admit: 2019-06-08 | Discharge: 2019-06-08 | Disposition: A | Discharge status: Home / Self Care | Payer: HRSA Program | Attending: Emergency Medicine | Admitting: Emergency Medicine

## 2019-06-08 ENCOUNTER — Encounter (HOSPITAL_COMMUNITY): Payer: Self-pay | Admitting: Emergency Medicine

## 2019-06-08 ENCOUNTER — Emergency Department (HOSPITAL_COMMUNITY): Payer: HRSA Program

## 2019-06-08 ENCOUNTER — Other Ambulatory Visit: Payer: Self-pay

## 2019-06-08 DIAGNOSIS — U071 COVID-19: Secondary | ICD-10-CM | POA: Insufficient documentation

## 2019-06-08 DIAGNOSIS — Z79899 Other long term (current) drug therapy: Secondary | ICD-10-CM | POA: Insufficient documentation

## 2019-06-08 DIAGNOSIS — Z87891 Personal history of nicotine dependence: Secondary | ICD-10-CM | POA: Insufficient documentation

## 2019-06-08 DIAGNOSIS — J45909 Unspecified asthma, uncomplicated: Secondary | ICD-10-CM | POA: Diagnosis not present

## 2019-06-08 DIAGNOSIS — I10 Essential (primary) hypertension: Secondary | ICD-10-CM | POA: Diagnosis not present

## 2019-06-08 DIAGNOSIS — R05 Cough: Secondary | ICD-10-CM | POA: Insufficient documentation

## 2019-06-08 DIAGNOSIS — B349 Viral infection, unspecified: Secondary | ICD-10-CM

## 2019-06-08 DIAGNOSIS — R0981 Nasal congestion: Secondary | ICD-10-CM | POA: Diagnosis present

## 2019-06-08 MED ORDER — BENZONATATE 100 MG PO CAPS
200.0000 mg | ORAL_CAPSULE | Freq: Once | ORAL | Status: AC
  Administered 2019-06-08: 200 mg via ORAL
  Filled 2019-06-08: qty 2

## 2019-06-08 MED ORDER — PREDNISONE 10 MG PO TABS: ORAL_TABLET | ORAL | 0 refills | Status: DC

## 2019-06-08 MED ORDER — BENZONATATE 100 MG PO CAPS: 200.0000 mg | ORAL_CAPSULE | Freq: Three times a day (TID) | ORAL | 0 refills | Status: DC | PRN

## 2019-06-08 MED ORDER — PREDNISONE 50 MG PO TABS
60.0000 mg | ORAL_TABLET | Freq: Once | ORAL | Status: AC
  Administered 2019-06-08: 60 mg via ORAL
  Filled 2019-06-08: qty 1

## 2019-06-08 NOTE — ED Triage Notes (Signed)
Pt states that he has had congestion cough fever. It started on Saturday

## 2019-06-08 NOTE — Discharge Instructions (Addendum)
Your chest xray is reassuring today -there is no evidence of pneumonia.  You may take the tessalon as prescribed which can help with coughing as this illness runs its course.  I also recommend you take tylenol for help with your fever if it persists.  You have been screened for Covid 19 tonight and your results should be back within 1-2 days.  You should remain in your home and away from others until you get your result and it is negative.  If positive, you will be instructed further (and will need to stay home and isolated for 14 days from the day your symptoms began, as discussed.      Person Under Monitoring Name: Robert Drake  Location: [address] [place] [address]   Infection Prevention Recommendations for Individuals Confirmed to have, or Being Evaluated for, 2019 Novel Coronavirus (COVID-19) Infection Who Receive Care at Home  Individuals who are confirmed to have, or are being evaluated for, COVID-19 should follow the prevention steps below until a healthcare provider or local or state health department says they can return to normal activities.  Stay home except to get medical care You should restrict activities outside your home, except for getting medical care. Do not go to work, school, or public areas, and do not use public transportation or taxis.  Call ahead before visiting your doctor Before your medical appointment, call the healthcare provider and tell them that you have, or are being evaluated for, COVID-19 infection. This will help the healthcare providers office take steps to keep other people from getting infected. Ask your healthcare provider to call the local or state health department.  Monitor your symptoms Seek prompt medical attention if your illness is worsening (e.g., difficulty breathing). Before going to your medical appointment, call the healthcare provider and tell them that you have, or are being evaluated for, COVID-19 infection. Ask your  healthcare provider to call the local or state health department.  Wear a facemask You should wear a facemask that covers your nose and mouth when you are in the same room with other people and when you visit a healthcare provider. People who live with or visit you should also wear a facemask while they are in the same room with you.  Separate yourself from other people in your home As much as possible, you should stay in a different room from other people in your home. Also, you should use a separate bathroom, if available.  Avoid sharing household items You should not share dishes, drinking glasses, cups, eating utensils, towels, bedding, or other items with other people in your home. After using these items, you should wash them thoroughly with soap and water.  Cover your coughs and sneezes Cover your mouth and nose with a tissue when you cough or sneeze, or you can cough or sneeze into your sleeve. Throw used tissues in a lined trash can, and immediately wash your hands with soap and water for at least 20 seconds or use an alcohol-based hand rub.  Wash your [employer] your hands often and thoroughly with soap and water for at least 20 seconds. You can use an alcohol-based hand sanitizer if soap and water are not available and if your hands are not visibly dirty. Avoid touching your eyes, nose, and mouth with unwashed hands.   Prevention Steps for Caregivers and Household Members of Individuals Confirmed to have, or Being Evaluated for, COVID-19 Infection Being Cared for in the Home  If you live  with, or provide care at home for, a person confirmed to have, or being evaluated for, COVID-19 infection please follow these guidelines to prevent infection:  Follow healthcare providers instructions Make sure that you understand and can help the patient follow any healthcare provider instructions for all care.  Provide for the patients basic needs You should help the patient with  basic needs in the home and provide support for getting groceries, prescriptions, and other personal needs.  Monitor the patients symptoms If they are getting sicker, call his or her medical provider and tell them that the patient has, or is being evaluated for, COVID-19 infection. This will help the healthcare providers office take steps to keep other people from getting infected. Ask the healthcare provider to call the local or state health department.  Limit the number of people who have contact with the patient If possible, have only one caregiver for the patient. Other household members should stay in another home or place of residence. If this is not possible, they should stay in another room, or be separated from the patient as much as possible. Use a separate bathroom, if available. Restrict visitors who do not have an essential need to be in the home.  Keep older adults, very young children, and other sick people away from the patient Keep older adults, very young children, and those who have compromised immune systems or chronic health conditions away from the patient. This includes people with chronic heart, lung, or kidney conditions, diabetes, and cancer.  Ensure good ventilation Make sure that shared spaces in the home have good air flow, such as from an air conditioner or an opened window, weather permitting.  Wash your hands often Wash your hands often and thoroughly with soap and water for at least 20 seconds. You can use an alcohol based hand sanitizer if soap and water are not available and if your hands are not visibly dirty. Avoid touching your eyes, nose, and mouth with unwashed hands. Use disposable paper towels to dry your hands. If not available, use dedicated cloth towels and replace them when they become wet.  Wear a facemask and gloves Wear a disposable facemask at all times in the room and gloves when you touch or have contact with the patients blood, body  fluids, and/or secretions or excretions, such as sweat, saliva, sputum, nasal mucus, vomit, urine, or feces.  Ensure the mask fits over your nose and mouth tightly, and do not touch it during use. Throw out disposable facemasks and gloves after using them. Do not reuse. Wash your hands immediately after removing your facemask and gloves. If your personal clothing becomes contaminated, carefully remove clothing and launder. Wash your hands after handling contaminated clothing. Place all used disposable facemasks, gloves, and other waste in a lined container before disposing them with other household waste. Remove gloves and wash your hands immediately after handling these items.  Do not share dishes, glasses, or other household items with the patient Avoid sharing household items. You should not share dishes, drinking glasses, cups, eating utensils, towels, bedding, or other items with a patient who is confirmed to have, or being evaluated for, COVID-19 infection. After the person uses these items, you should wash them thoroughly with soap and water.  Wash laundry thoroughly Immediately remove and wash clothes or bedding that have blood, body fluids, and/or secretions or excretions, such as sweat, saliva, sputum, nasal mucus, vomit, urine, or feces, on them. Wear gloves when handling laundry from the patient. Read  and follow directions on labels of laundry or clothing items and detergent. In general, wash and dry with the warmest temperatures recommended on the label.  Clean all areas the individual has used often Clean all touchable surfaces, such as counters, tabletops, doorknobs, bathroom fixtures, toilets, phones, keyboards, tablets, and bedside tables, every day. Also, clean any surfaces that may have blood, body fluids, and/or secretions or excretions on them. Wear gloves when cleaning surfaces the patient has come in contact with. Use a diluted bleach solution (e.g., dilute bleach with 1  part bleach and 10 parts water) or a household disinfectant with a label that says EPA-registered for coronaviruses. To make a bleach solution at home, add 1 tablespoon of bleach to 1 quart (4 cups) of water. For a larger supply, add  cup of bleach to 1 gallon (16 cups) of water. Read labels of cleaning products and follow recommendations provided on product labels. Labels contain instructions for safe and effective use of the cleaning product including precautions you should take when applying the product, such as wearing gloves or eye protection and making sure you have good ventilation during use of the product. Remove gloves and wash hands immediately after cleaning.  Monitor yourself for signs and symptoms of illness Caregivers and household members are considered close contacts, should monitor their health, and will be asked to limit movement outside of the home to the extent possible. Follow the monitoring steps for close contacts listed on the symptom monitoring form.   ? If you have additional questions, contact your local health department or call the epidemiologist on call at [phone] (available 24/7). ? This guidance is subject to change. For the most up-to-date guidance from [facility], please refer to their website: [email or web address]

## 2019-06-09 NOTE — ED Provider Notes (Signed)
[facility] EMERGENCY DEPARTMENT Provider Note   CSN: [id number] Arrival date & time: 06/08/19  1705     History   Chief Complaint Chief Complaint  Patient presents with  . Influenza    HPI Robert Drake is a 33 y.o. male with a history of asthma and HTN presenting with a 5 day history of uri type symptoms which includes nasal congestion with clear rhinorrhea, low grade subjective fever along with a nonproductive cough and intermittent wheezing which has responded to her albuterol mdi tx.   Symptoms do not include shortness of breath, chest pain,  Nausea, vomiting or diarrhea.  To his knowledge he has not been exposed to Covid 19 or others with similar sx.  He states he is more concerned about the flu as he feels achy, just like when he had the flu previously.  The patient has additionally taken an otc cough and cold preparation with some transient improvement in his symptoms.     The history is provided by the patient.    Past Medical History:  Diagnosis Date  . Asthma   . Bronchitis   . Bronchitis   . Hypertension     There are no active problems to display for this patient.   History reviewed. No pertinent surgical history.      Home Medications    Prior to Admission medications   Medication Sig Start Date End Date Taking? Authorizing Provider  albuterol (PROVENTIL HFA;VENTOLIN HFA) 108 (90 BASE) MCG/ACT inhaler Inhale 2 puffs into the lungs every 4 (four) hours as needed. 09/05/14   Devoria AlbeKnapp, Iva, MD  benzonatate (TESSALON) 100 MG capsule Take 2 capsules (200 mg total) by mouth 3 (three) times daily as needed. 06/08/19   Burgess AmorIdol, Nanci Lakatos, PA-C  hydrochlorothiazide (HYDRODIURIL) 25 MG tablet Take 1 tablet (25 mg total) by mouth daily. 01/20/16   Elson AreasSofia, Leslie K, PA-C  ibuprofen (ADVIL,MOTRIN) 200 MG tablet Take 400 mg by mouth every 6 (six) hours as needed for pain.    [provider]  mometasone-formoterol (DULERA) 200-5 MCG/ACT AERO Inhale 2 puffs into the lungs 2  (two) times daily.    [provider]  oseltamivir (TAMIFLU) 75 MG capsule Take 1 capsule (75 mg total) by mouth every 12 (twelve) hours. 11/11/16   Samuel JesterMcManus, Kathleen, DO  predniSONE (DELTASONE) 10 MG tablet Take 6 tablets day one, 5 tablets day two, 4 tablets day three, 3 tablets day four, 2 tablets day five, then 1 tablet day six 06/08/19   Burgess AmorIdol, Sennie Borden, PA-C    Family History No family history on file.  Social History Social History   Tobacco Use  . Smoking status: Former Smoker    Packs/day: 0.03    Years: 2.00    Pack years: 0.06    Types: Cigarettes  . Smokeless tobacco: Never Used  Substance Use Topics  . Alcohol use: No  . Drug use: Yes    Types: Marijuana    Comment: occasionally     Allergies   Robitussin [guaifenesin]   Review of Systems Review of Systems  Constitutional: Positive for fever. Negative for chills.  HENT: Positive for congestion and rhinorrhea. Negative for ear pain, sinus pressure, sore throat, trouble swallowing and voice change.   Eyes: Negative for discharge.  Respiratory: Positive for cough and wheezing. Negative for shortness of breath and stridor.   Cardiovascular: Negative for chest pain.  Gastrointestinal: Negative for abdominal pain, nausea and vomiting.  Genitourinary: Negative.   Musculoskeletal: Positive for myalgias.  Physical Exam Updated Vital Signs BP (!) 146/94 (BP Location: Right Arm)   Pulse 87   Temp 98.6 F (37 C) (Oral)   Resp 18   Ht 5\' 6"  (1.676 m)   Wt 101.2 kg   SpO2 100%   BMI 35.99 kg/m   Physical Exam Vitals signs reviewed.  Constitutional:      Appearance: He is well-developed.  HENT:     Head: Normocephalic and atraumatic.     Right Ear: Tympanic membrane and ear canal normal.     Left Ear: Tympanic membrane and ear canal normal.     Nose: Mucosal edema and rhinorrhea present.     Mouth/Throat:     Pharynx: Uvula midline. No oropharyngeal exudate or posterior oropharyngeal erythema.      Tonsils: No tonsillar abscesses.  Eyes:     Conjunctiva/sclera: Conjunctivae normal.  Neck:     Musculoskeletal: Normal range of motion.  Cardiovascular:     Rate and Rhythm: Normal rate.     Heart sounds: Normal heart sounds.  Pulmonary:     Effort: Pulmonary effort is normal. No respiratory distress.     Breath sounds: No wheezing or rales.  Abdominal:     Palpations: Abdomen is soft.     Tenderness: There is no abdominal tenderness.  Musculoskeletal: Normal range of motion.  Skin:    General: Skin is warm and dry.     Findings: No rash.  Neurological:     Mental Status: He is alert and oriented to person, place, and time.      ED Treatments / Results  Labs (all labs ordered are listed, but only abnormal results are displayed) Labs Reviewed  NOVEL CORONAVIRUS, NAA (HOSP ORDER, SEND-OUT TO REF LAB; TAT 18-24 HRS)    EKG None  Radiology Dg Chest Portable 1 View  Result Date: 06/08/2019 CLINICAL DATA:  Fever, cough, and chest congestion. EXAM: PORTABLE CHEST 1 VIEW COMPARISON:  06/20/2015 FINDINGS: The heart size and mediastinal contours are within normal limits. Both lungs are clear except for slight peribronchial thickening. No effusions the visualized skeletal structures are unremarkable. IMPRESSION: Slight bronchitic changes. Otherwise, normal exam. Electronically Signed   By: Lorriane Shire M.D.   On: 06/08/2019 21:46    Procedures Procedures (including critical care time)  Medications Ordered in ED Medications  benzonatate (TESSALON) capsule 200 mg (200 mg Oral Given 06/08/19 2230)  predniSONE (DELTASONE) tablet 60 mg (60 mg Oral Given 06/08/19 2252)     Initial Impression / Assessment and Plan / ED Course  I have reviewed the triage vital signs and the nursing notes.  Pertinent labs & imaging results that were available during my care of the patient were reviewed by me and considered in my medical decision making (see chart for details).        Pt with  viral uri sx, no respiratory distress with stable VS. Suspect viral syndrome, possible covid, but low risk,  Send out covid testing performed.  Discussed home isolation until (negative) covid result obtained.  Discussed elevated bp and need for recheck.  Robert Drake was evaluated in Emergency Department on 06/09/2019 for the symptoms described in the history of present illness. He was evaluated in the context of the global COVID-19 pandemic, which necessitated consideration that the patient might be at risk for infection with the SARS-CoV-2 virus that causes COVID-19. Institutional protocols and algorithms that pertain to the evaluation of patients at risk for COVID-19 are in a state of rapid change  based on information released by regulatory bodies including the CDC and federal and state organizations. These policies and algorithms were followed during the patient's care in the ED.   Final Clinical Impressions(s) / ED Diagnoses   Final diagnoses:  Viral syndrome  Essential hypertension    ED Discharge Orders         Ordered    benzonatate (TESSALON) 100 MG capsule  3 times daily PRN     06/08/19 2223    predniSONE (DELTASONE) 10 MG tablet     06/08/19 2242           Burgess Amor, PA-C 06/11/19 0058    Samuel Jester, DO 06/14/19 1114

## 2019-06-10 LAB — NOVEL CORONAVIRUS, NAA (HOSP ORDER, SEND-OUT TO REF LAB; TAT 18-24 HRS): SARS-CoV-2, NAA: DETECTED — AB

## 2020-01-20 ENCOUNTER — Encounter (HOSPITAL_COMMUNITY): Payer: Self-pay | Admitting: *Deleted

## 2020-01-20 ENCOUNTER — Emergency Department (HOSPITAL_COMMUNITY)
Admission: EM | Admit: 2020-01-20 | Discharge: 2020-01-20 | Disposition: A | Discharge status: Home / Self Care | Payer: Self-pay | Attending: Emergency Medicine | Admitting: Emergency Medicine

## 2020-01-20 ENCOUNTER — Other Ambulatory Visit: Payer: Self-pay

## 2020-01-20 DIAGNOSIS — R300 Dysuria: Secondary | ICD-10-CM | POA: Insufficient documentation

## 2020-01-20 DIAGNOSIS — Z202 Contact with and (suspected) exposure to infections with a predominantly sexual mode of transmission: Secondary | ICD-10-CM | POA: Insufficient documentation

## 2020-01-20 DIAGNOSIS — Z114 Encounter for screening for human immunodeficiency virus [HIV]: Secondary | ICD-10-CM | POA: Insufficient documentation

## 2020-01-20 LAB — URINALYSIS, ROUTINE W REFLEX MICROSCOPIC
Bacteria, UA: NONE SEEN
Bilirubin Urine: NEGATIVE
Glucose, UA: NEGATIVE mg/dL
Hgb urine dipstick: NEGATIVE
Ketones, ur: NEGATIVE mg/dL
Nitrite: NEGATIVE
Protein, ur: NEGATIVE mg/dL
Specific Gravity, Urine: 1.027 (ref 1.005–1.030)
WBC, UA: >50 WBC/hpf — ABNORMAL HIGH (ref 0–5)
pH: 5 (ref 5.0–8.0)

## 2020-01-20 LAB — RAPID HIV SCREEN (HIV 1/2 AB+AG)
HIV 1/2 Antibodies: NONREACTIVE
HIV-1 P24 Antigen - HIV24: NONREACTIVE

## 2020-01-20 LAB — HIV ANTIBODY (ROUTINE TESTING W REFLEX): HIV Screen 4th Generation wRfx: NONREACTIVE

## 2020-01-20 MED ORDER — LIDOCAINE HCL (PF) 1 % IJ SOLN
INTRAMUSCULAR | Status: AC
  Filled 2020-01-20: qty 2

## 2020-01-20 MED ORDER — DOXYCYCLINE HYCLATE 100 MG PO TABS
100.0000 mg | ORAL_TABLET | Freq: Once | ORAL | Status: AC
  Administered 2020-01-20: 100 mg via ORAL
  Filled 2020-01-20: qty 1

## 2020-01-20 MED ORDER — CEFTRIAXONE SODIUM 500 MG IJ SOLR
500.0000 mg | Freq: Once | INTRAMUSCULAR | Status: AC
  Administered 2020-01-20: 14:00:00 500 mg via INTRAMUSCULAR
  Filled 2020-01-20: qty 500

## 2020-01-20 MED ORDER — DOXYCYCLINE HYCLATE 100 MG PO CAPS: 100.0000 mg | ORAL_CAPSULE | Freq: Two times a day (BID) | ORAL | 0 refills | Status: AC

## 2020-01-20 NOTE — ED Provider Notes (Signed)
[name] Provider Note   CSN: [id number] Arrival date & time: 01/20/20  1229     History Chief Complaint  Patient presents with  . Exposure to STD    Robert Drake is a 34 y.o. male.  HPI 34 year old male with a history of asthma and bronchitis, hypertension presents to the ER with concern for STD exposure.  Patient states that last week and he went out with friends and had a unprotected sexual encounter with a longtime friend.  Patient noted burning during urination since on Tuesday, with subsequent discharge coming out of his penis on Wednesday.  He denies any difficulty urinating, hematuria, penile pain, swelling, scrotal pain/swelling, nausea, vomiting, abdominal pain, painful bowel movements, fevers, chills.    Past Medical History:  Diagnosis Date  . Asthma   . Bronchitis   . Bronchitis   . Hypertension     There are no problems to display for this patient.   History reviewed. No pertinent surgical history.     No family history on file.  Social History   Tobacco Use  . Smoking status: Former Smoker    Packs/day: 0.03    Years: 2.00    Pack years: 0.06    Types: Cigarettes  . Smokeless tobacco: Never Used  Substance Use Topics  . Alcohol use: No  . Drug use: Yes    Types: Marijuana    Comment: occasionally    Home Medications Prior to Admission medications   Medication Sig Start Date End Date Taking? Authorizing Provider  albuterol (PROVENTIL HFA;VENTOLIN HFA) 108 (90 BASE) MCG/ACT inhaler Inhale 2 puffs into the lungs every 4 (four) hours as needed. 09/05/14   Rolland Porter, MD  benzonatate (TESSALON) 100 MG capsule Take 2 capsules (200 mg total) by mouth 3 (three) times daily as needed. 06/08/19   Evalee Jefferson, PA-C  doxycycline (VIBRAMYCIN) 100 MG capsule Take 1 capsule (100 mg total) by mouth 2 (two) times daily for 7 days. 01/20/20 01/27/20  Garald Balding, PA-C  hydrochlorothiazide (HYDRODIURIL) 25 MG tablet Take 1 tablet (25 mg  total) by mouth daily. 01/20/16   Fransico Meadow, PA-C  ibuprofen (ADVIL,MOTRIN) 200 MG tablet Take 400 mg by mouth every 6 (six) hours as needed for pain.    [provider]  mometasone-formoterol (DULERA) 200-5 MCG/ACT AERO Inhale 2 puffs into the lungs 2 (two) times daily.    [provider]  oseltamivir (TAMIFLU) 75 MG capsule Take 1 capsule (75 mg total) by mouth every 12 (twelve) hours. 11/11/16   Francine Graven, DO  predniSONE (DELTASONE) 10 MG tablet Take 6 tablets day one, 5 tablets day two, 4 tablets day three, 3 tablets day four, 2 tablets day five, then 1 tablet day six 06/08/19   Idol, Almyra Free, PA-C    Allergies    Robitussin [guaifenesin]  Review of Systems   Review of Systems  Constitutional: Negative for appetite change and fever.  Gastrointestinal: Negative for abdominal pain, diarrhea, nausea, rectal pain and vomiting.  Genitourinary: Positive for discharge and dysuria. Negative for decreased urine volume, difficulty urinating, frequency, genital sores, penile pain, penile swelling, scrotal swelling, testicular pain and urgency.    Physical Exam Updated Vital Signs BP (!) 139/93 (BP Location: Right Arm)   Pulse 74   Temp 98.1 F (36.7 C) (Oral)   Ht 5\' 8"  (1.727 m)   Wt 104.1 kg   SpO2 99%   BMI 34.91 kg/m   Physical Exam Vitals and nursing note  reviewed.  Constitutional:      General: He is not in acute distress.    Appearance: He is well-developed. He is obese. He is not ill-appearing, toxic-appearing or diaphoretic.  HENT:     Head: Normocephalic and atraumatic.     Nose: Nose normal.     Mouth/Throat:     Mouth: Mucous membranes are moist.     Pharynx: Oropharynx is clear.  Eyes:     Extraocular Movements: Extraocular movements intact.     Conjunctiva/sclera: Conjunctivae normal.     Pupils: Pupils are equal, round, and reactive to light.  Cardiovascular:     Rate and Rhythm: Normal rate and regular rhythm.     Pulses: Normal pulses.      Heart sounds: Normal heart sounds. No murmur.  Pulmonary:     Effort: Pulmonary effort is normal. No respiratory distress.     Breath sounds: Normal breath sounds.  Abdominal:     General: Abdomen is flat.     Palpations: Abdomen is soft.     Tenderness: There is no abdominal tenderness.  Genitourinary:    Penis: Normal.      Testes: Normal.     Prostate: Normal.     Comments: Scant penile discharge.  No evidence of erythema around urethra.  No evidence of genital sores.  Patient nontender to palpation. Musculoskeletal:     Cervical back: Normal range of motion and neck supple.  Skin:    General: Skin is warm and dry.     Findings: No erythema, lesion or rash.  Neurological:     General: No focal deficit present.     Mental Status: He is alert.     Sensory: No sensory deficit.     Motor: No weakness.  Psychiatric:        Mood and Affect: Mood normal.        Behavior: Behavior normal.     ED Results / Procedures / Treatments   Labs (all labs ordered are listed, but only abnormal results are displayed) Labs Reviewed  URINALYSIS, ROUTINE W REFLEX MICROSCOPIC  HIV ANTIBODY (ROUTINE TESTING W REFLEX)  RAPID HIV SCREEN (HIV 1/2 AB+AG)  GC/CHLAMYDIA PROBE AMP ([place]) NOT AT [facility]    EKG None  Radiology No results found.  Procedures Procedures (including critical care time)  Medications Ordered in ED Medications  cefTRIAXone (ROCEPHIN) injection 500 mg (500 mg Intramuscular Given 01/20/20 1410)  doxycycline (VIBRA-TABS) tablet 100 mg (100 mg Oral Given 01/20/20 1410)  lidocaine (PF) (XYLOCAINE) 1 % injection (  Given 01/20/20 1411)    ED Course  I have reviewed the triage vital signs and the nursing notes.  Pertinent labs & imaging results that were available during my care of the patient were reviewed by me and considered in my medical decision making (see chart for details).    MDM Rules/Calculators/A&P                      Patient is afebrile without  abdominal tenderness, abdominal pain or painful bowel movements to indicate prostatitis.  No tenderness to palpation of the testes or epididymis to suggest orchitis or epididymitis.  STD cultures obtained including HIV, gonorrhea and chlamydia. Patient to be discharged with instructions to follow up with PCP. Discussed importance of using protection when sexually active. Pt understands that they have GC/Chlamydia/HIV cultures pending and that they will need to inform all sexual partners if results return positive. Patient has been treated prophylactically with  Doxy and Rocephin.  Return precautions given.  He voices understanding is agreeable to this plan.  At this stage in the ED course the patient has been adequately screened and is stable for discharge.    Final Clinical Impression(s) / ED Diagnoses Final diagnoses:  STD exposure    Rx / DC Orders ED Discharge Orders         Ordered    doxycycline (VIBRAMYCIN) 100 MG capsule  2 times daily     01/20/20 1355           Leone Brand 01/20/20 1427    Bethann Berkshire, MD 01/21/20 [phone]

## 2020-01-20 NOTE — ED Triage Notes (Signed)
Thinks he has a sexually transmitted disease, burning with urination

## 2020-01-20 NOTE — ED Notes (Signed)
Patient unable to give urine specimen at this time.

## 2020-01-20 NOTE — Discharge Instructions (Addendum)
Do not have sex for 2 weeks °Have all partners tested and treated °If your test is abnormal, you will be called but you have been treated for Gonorrhea and Chlamydia today. You can also review your results on MyChart °Practice safe sex and use a condom to prevent infection or unwanted pregnancy °Follow up with the Health Department ° °

## 2020-01-22 LAB — URINE CULTURE

## 2020-12-08 IMAGING — CR DG CHEST 1V PORT
1 series · 2 of 2 positions shown · non-contrast
Comparison: 06/20/2015

CLINICAL DATA: Fever, cough, and chest congestion.

EXAM:
PORTABLE CHEST 1 VIEW

[Series 1: portable · 0.17mm/px · 2 of 2 slices shown]
[im 1/2]
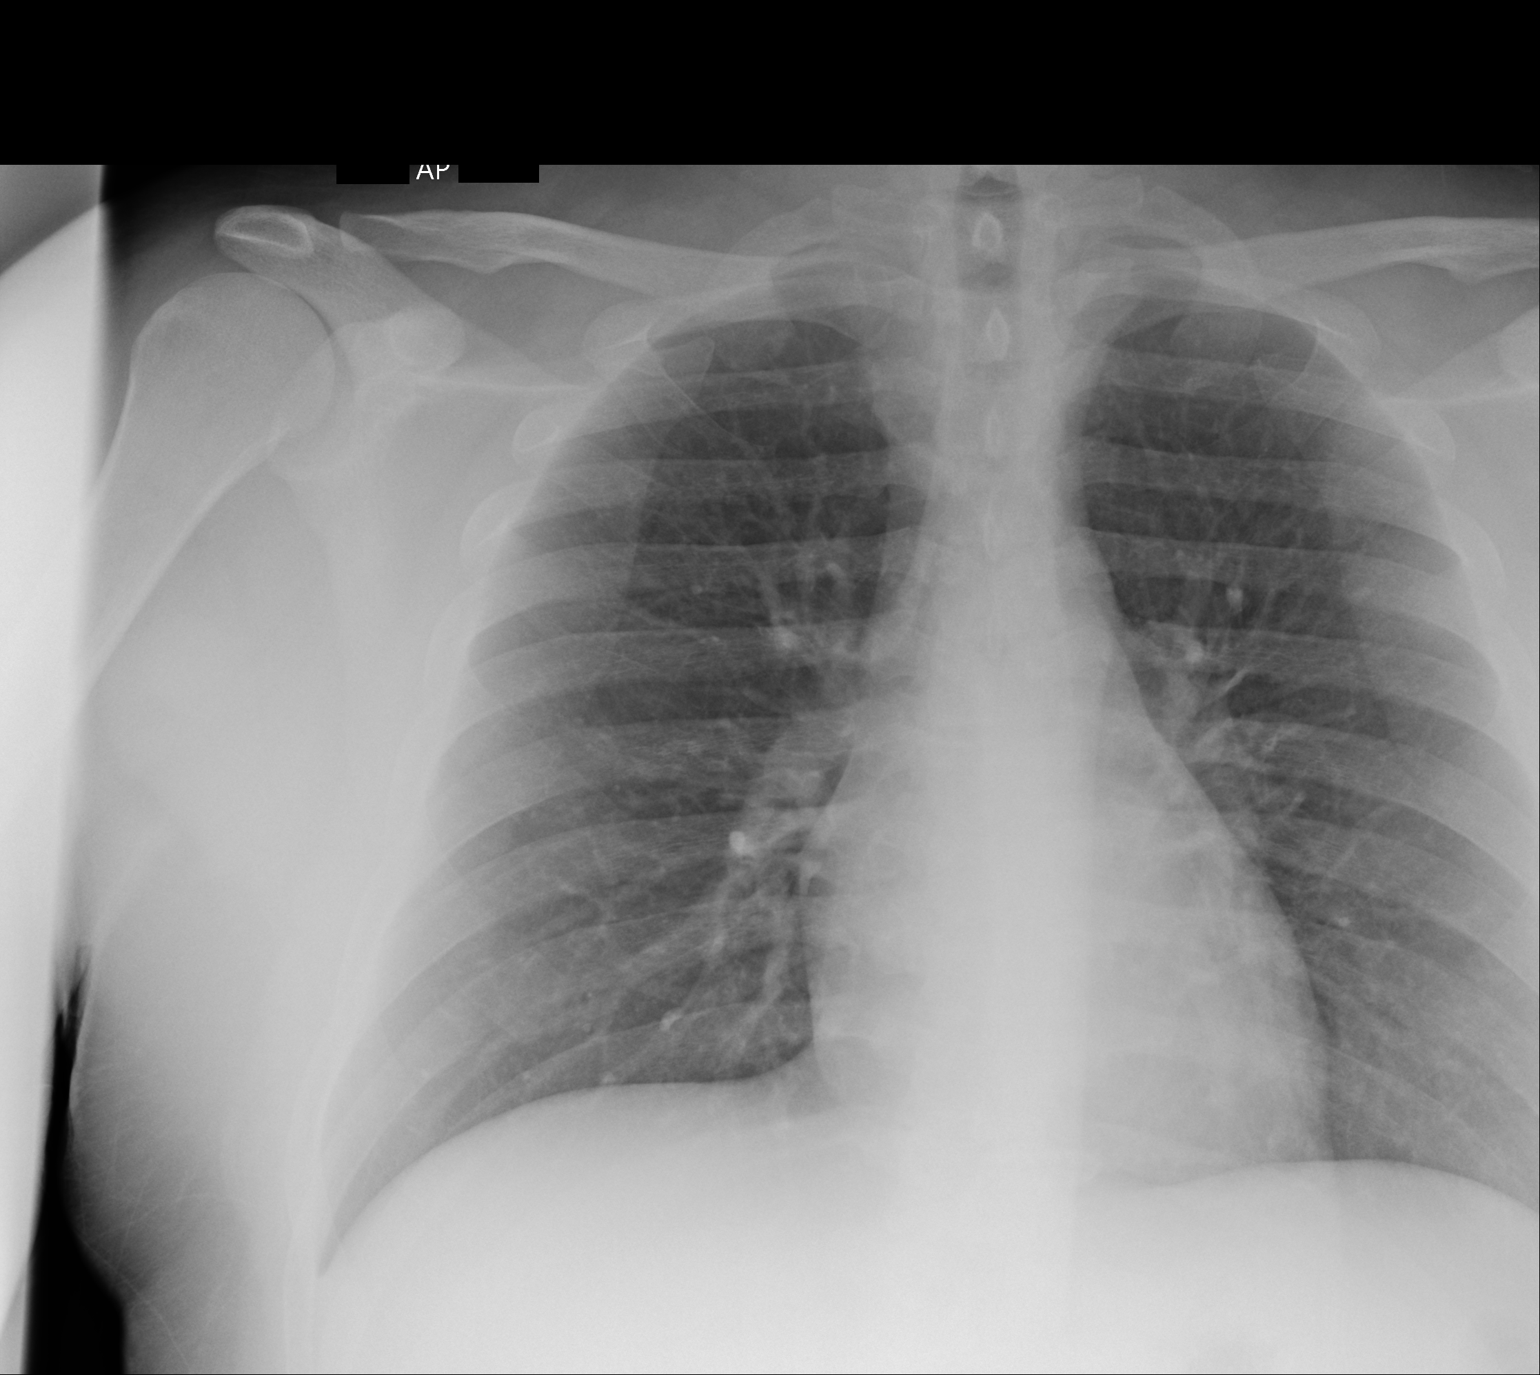
[im 2/2]
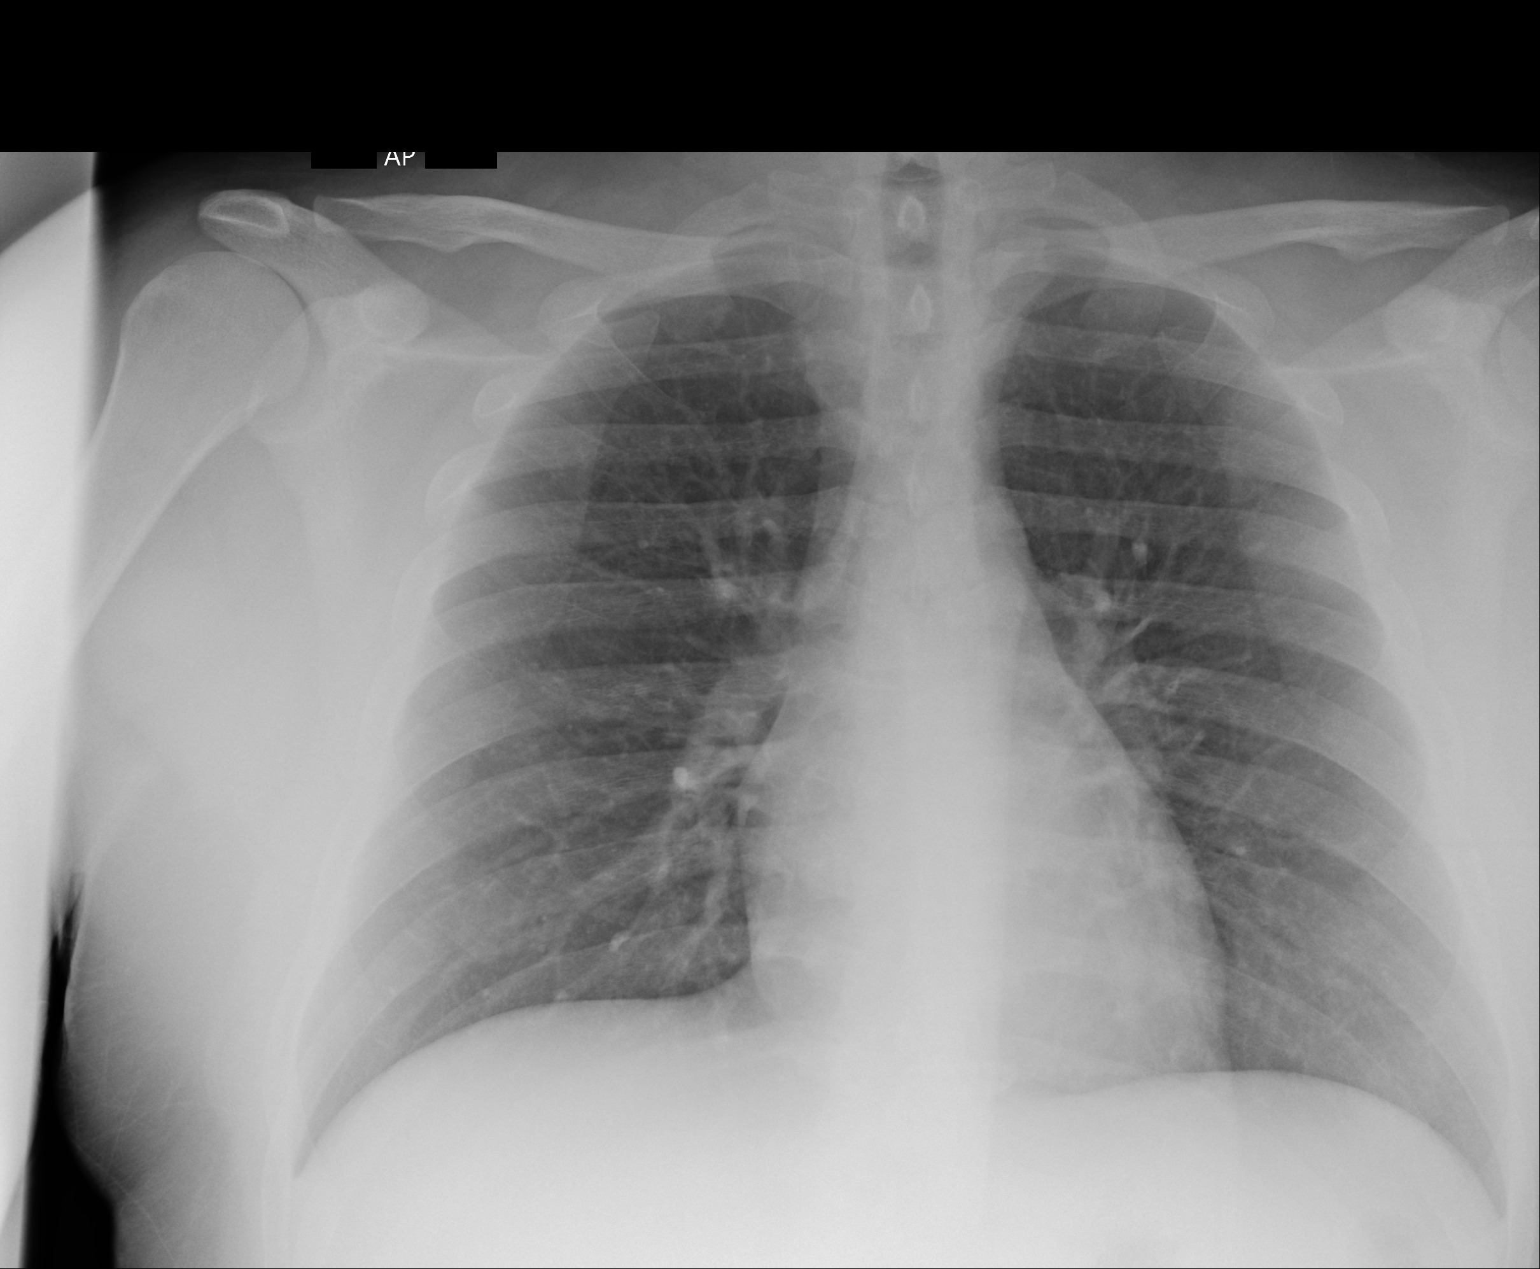

[2 of 2 positions shown; findings below may reference images not displayed]

FINDINGS: The heart size and mediastinal contours are within normal limits.
Both lungs are clear except for slight peribronchial thickening. No
effusions the visualized skeletal structures are unremarkable.
IMPRESSION: Slight bronchitic changes. Otherwise, normal exam.

## 2024-01-07 ENCOUNTER — Emergency Department (HOSPITAL_COMMUNITY)
Admission: EM | Admit: 2024-01-07 | Discharge: 2024-01-07 | Disposition: A | Discharge status: Home / Self Care | Payer: Self-pay | Attending: Emergency Medicine | Admitting: Emergency Medicine

## 2024-01-07 ENCOUNTER — Encounter (HOSPITAL_COMMUNITY): Payer: Self-pay

## 2024-01-07 ENCOUNTER — Other Ambulatory Visit: Payer: Self-pay

## 2024-01-07 DIAGNOSIS — I10 Essential (primary) hypertension: Secondary | ICD-10-CM

## 2024-01-07 DIAGNOSIS — Z79899 Other long term (current) drug therapy: Secondary | ICD-10-CM | POA: Insufficient documentation

## 2024-01-07 MED ORDER — HYDROCHLOROTHIAZIDE 25 MG PO TABS
25.0000 mg | ORAL_TABLET | Freq: Every day | ORAL | Status: DC
  Administered 2024-01-07: 25 mg via ORAL
  Filled 2024-01-07: qty 1

## 2024-01-07 MED ORDER — HYDROCHLOROTHIAZIDE 25 MG PO TABS: 25.0000 mg | ORAL_TABLET | Freq: Every day | ORAL | 0 refills | Status: DC

## 2024-01-07 NOTE — ED Notes (Signed)
 ED Provider at bedside.

## 2024-01-07 NOTE — ED Triage Notes (Signed)
 Pt states his BP has been running high, states at home today it was 177/133. Pt states he has not had his BP meds since January. States he does have mild headaches off an on.

## 2024-01-07 NOTE — ED Provider Notes (Signed)
 [facility] EMERGENCY DEPARTMENT AT [facility] Provider Note   CSN: [id number] Arrival date & time: 01/07/24  1259     History Chief Complaint  Patient presents with   Hypertension    Robert Drake is a 38 y.o. male patient with history of hypertension who presents to the emergency department today for further evaluation of elevated blood pressures at home.  Patient states he has been without his medication since at least January as he was incarcerated during that time.  He started having a headache a couple of days ago and has been noticing some increased fatigability and has been sleeping a lot.  He had a headache this morning and took his blood pressure and was noted to be in the 170s systolic and over 100 diastolic which prompted him to come in.  He denies any focal weakness, focal numbness, blurred vision, chest pain, shortness of breath, abdominal pain, nausea, vomiting, diarrhea.  He states that while he was on hydrochlorothiazide  25 mg that his blood pressure was under control.   Hypertension       Home Medications Prior to Admission medications   Medication Sig Start Date End Date Taking? Authorizing Provider  hydrochlorothiazide  (HYDRODIURIL ) 25 MG tablet Take 1 tablet (25 mg total) by mouth daily. 01/07/24  Yes Jamal Mays, Lavene Penagos M, PA-C  albuterol  (PROVENTIL  HFA;VENTOLIN  HFA) 108 (90 BASE) MCG/ACT inhaler Inhale 2 puffs into the lungs every 4 (four) hours as needed. 09/05/14   Knapp, Iva, MD  benzonatate  (TESSALON ) 100 MG capsule Take 2 capsules (200 mg total) by mouth 3 (three) times daily as needed. 06/08/19   Idol, Julie, PA-C  hydrochlorothiazide  (HYDRODIURIL ) 25 MG tablet Take 1 tablet (25 mg total) by mouth daily. 01/20/16   Sofia, Leslie K, PA-C  ibuprofen  (ADVIL ,MOTRIN ) 200 MG tablet Take 400 mg by mouth every 6 (six) hours as needed for pain.    [provider]  mometasone-formoterol (DULERA) 200-5 MCG/ACT AERO Inhale 2 puffs into the lungs 2 (two)  times daily.    [provider]  oseltamivir  (TAMIFLU ) 75 MG capsule Take 1 capsule (75 mg total) by mouth every 12 (twelve) hours. 11/11/16   Shara Das, DO  predniSONE  (DELTASONE ) 10 MG tablet Take 6 tablets day one, 5 tablets day two, 4 tablets day three, 3 tablets day four, 2 tablets day five, then 1 tablet day six 06/08/19   Idol, Julie, PA-C      Allergies    Robitussin [guaifenesin]    Review of Systems   Review of Systems  All other systems reviewed and are negative.   Physical Exam Updated Vital Signs BP (!) 174/114 (BP Location: Right Arm)   Pulse 66   Temp 98.5 F (36.9 C) (Oral)   Resp 16   Ht 5\' 7"  (1.702 m)   Wt 103.9 kg   SpO2 98%   BMI 35.87 kg/m  Physical Exam Vitals and nursing note reviewed.  Constitutional:      General: He is not in acute distress.    Appearance: Normal appearance.  HENT:     Head: Normocephalic and atraumatic.  Eyes:     General:        Right eye: No discharge.        Left eye: No discharge.  Cardiovascular:     Comments: Regular rate and rhythm.  S1/S2 are distinct without any evidence of murmur, rubs, or gallops.  Radial pulses are 2+ bilaterally.  Dorsalis pedis pulses are 2+ bilaterally.  No  evidence of pedal edema. Pulmonary:     Comments: Clear to auscultation bilaterally.  Normal effort.  No respiratory distress.  No evidence of wheezes, rales, or rhonchi heard throughout. Abdominal:     General: Abdomen is flat. Bowel sounds are normal. There is no distension.     Tenderness: There is no abdominal tenderness. There is no guarding or rebound.  Musculoskeletal:        General: Normal range of motion.     Cervical back: Neck supple.  Skin:    General: Skin is warm and dry.     Findings: No rash.  Neurological:     General: No focal deficit present.     Mental Status: He is alert.     Comments: Cranial nerves II through XII are intact.  5/5 strength to the upper and lower extremities.  Normal sensation to the  upper and lower extremities.  Extraocular movements are intact.  No visible nystagmus.  Speech is normal.  Answers all questions appropriately.  Psychiatric:        Mood and Affect: Mood normal.        Behavior: Behavior normal.     ED Results / Procedures / Treatments   Labs (all labs ordered are listed, but only abnormal results are displayed) Labs Reviewed - No data to display  EKG None  Radiology No results found.  Procedures Procedures    Medications Ordered in ED Medications  hydrochlorothiazide  (HYDRODIURIL ) tablet 25 mg (25 mg Oral Given 01/07/24 1409)    ED Course/ Medical Decision Making/ A&P Clinical Course as of 01/07/24 1602  Thu Jan 07, 2024  1555 On repeat evaluation, patient is feeling much better after hydrochlorothiazide .  His blood pressure is still elevated but given that the patient has not had any antihypertensive medication for several months I suspect this is likely where his baseline is.  Concern that the patient is feeling better I will plan to discharge him home with a prescription of hydrochlorothiazide  25 mg and he will follow-up with his primary care doctor. [CF]    Clinical Course User Index [CF] Darletta Ehrich, PA-C   {   Click here for ABCD2, HEART and other calculators  Medical Decision Making Robert Drake is a 38 y.o. male patient who presents to the emergency department with elevated blood pressures.  Patient is just over the cusp of hypertensive urgency criteria.  Given that the patient is not currently having any symptoms of the headache and he does not have any neurological findings I do not feel that any labs or imaging is warranted at this time.  I will plan to give him hydrochlorothiazide  25 mg and observe him for the next 60 to 90 minutes and monitor his blood pressure.  If it continues to downtrend I will write him a prescription he can follow-up with his outpatient primary care physician.   Risk Prescription drug  management.    Final Clinical Impression(s) / ED Diagnoses Final diagnoses:  Hypertension, unspecified type    Rx / DC Orders ED Discharge Orders          Ordered    hydrochlorothiazide  (HYDRODIURIL ) 25 MG tablet  Daily        01/07/24 1558              Angelyn Kennel [place], [place] 01/07/24 1602    Teddi Favors, DO 01/09/24 [phone]

## 2024-01-07 NOTE — Discharge Instructions (Signed)
 As we discussed, I have refilled your blood pressure medication today.  Please take as prescribed.  I would like for you to follow-up with your primary care doctor for further evaluation.  Continue taking your blood pressure twice a day, once in the morning and once in the evening.  Keep a blood pressure log.  You may return to the emergency department for any worsening symptoms.

## 2024-03-24 ENCOUNTER — Ambulatory Visit: Payer: Self-pay

## 2024-03-24 ENCOUNTER — Ambulatory Visit: Payer: Self-pay | Admitting: Nurse Practitioner

## 2024-03-24 ENCOUNTER — Encounter: Payer: Self-pay | Admitting: Emergency Medicine

## 2024-03-24 ENCOUNTER — Ambulatory Visit
Admission: EM | Admit: 2024-03-24 | Discharge: 2024-03-24 | Disposition: A | Discharge status: Home / Self Care | Attending: Family Medicine | Admitting: Family Medicine

## 2024-03-24 DIAGNOSIS — J4540 Moderate persistent asthma, uncomplicated: Secondary | ICD-10-CM | POA: Diagnosis not present

## 2024-03-24 DIAGNOSIS — I10 Essential (primary) hypertension: Secondary | ICD-10-CM

## 2024-03-24 MED ORDER — ALBUTEROL SULFATE HFA 108 (90 BASE) MCG/ACT IN AERS: 2.0000 | INHALATION_SPRAY | RESPIRATORY_TRACT | 0 refills | Status: AC | PRN

## 2024-03-24 MED ORDER — ALBUTEROL SULFATE HFA 108 (90 BASE) MCG/ACT IN AERS: 2.0000 | INHALATION_SPRAY | RESPIRATORY_TRACT | 0 refills | Status: DC | PRN

## 2024-03-24 MED ORDER — DULERA 200-5 MCG/ACT IN AERO: 2.0000 | INHALATION_SPRAY | Freq: Two times a day (BID) | RESPIRATORY_TRACT | 1 refills | Status: AC

## 2024-03-24 MED ORDER — HYDROCHLOROTHIAZIDE 25 MG PO TABS: 25.0000 mg | ORAL_TABLET | Freq: Every day | ORAL | 1 refills | Status: AC

## 2024-03-24 NOTE — Discharge Instructions (Signed)
 I have refilled your inhalers and your blood pressure medication.  Keep a log of your home blood pressure readings and bring this to your primary care appointment.  Follow-up for any concerns in the meantime.

## 2024-03-24 NOTE — Telephone Encounter (Signed)
   Copied from CRM [id number]. Topic: Clinical - Red Word Triage >> Mar 24, 2024  3:59 PM Donna BRAVO wrote: Red Word that prompted transfer to Nurse Triage: Patient calling to schedule new patient appt and establish care with Mitzie Carpen NP,  patient is out of BP and asthma medication, patient has headaches, really tired Reason for Disposition  [1] MODERATE headache (e.g., interferes with normal activities) AND [2] present > 24 hours AND [3] unexplained  (Exceptions: Pain medicines not tried, typical migraine, or headache part of viral illness.)  Answer Assessment - Initial Assessment Questions 1. LOCATION: Where does it hurt?      Headache  2. ONSET: When did the headache start? (e.g., minutes, hours, days)      ongoing 3. PATTERN: Does the pain come and go, or has it been constant since it started?     intermittent 4. SEVERITY: How bad is the pain? and What does it keep you from doing?  (e.g., Scale 1-10; mild, moderate, or severe)     Mild 5. RECURRENT SYMPTOM: Have you ever had headaches before? If Yes, ask: When was the last time? and What happened that time?      no 6. CAUSE: What do you think is causing the headache?     Blood pressure 7. MIGRAINE: Have you been diagnosed with migraine headaches? If Yes, ask: Is this headache similar?      yes 8. HEAD INJURY: Has there been any recent injury to your head?      No 9. OTHER SYMPTOMS: Do you have any other symptoms? (e.g., fever, stiff neck, eye pain, sore throat, cold symptoms)   Bilateral leg swelling.  Additional info: 1) called to establish care, new pt appointment scheduled 04/21/24 with requested provider. Advised urgent care today for acute symptoms and one time blood pressure med refill.  160/90 yesterday, has not taken blood pressure today. Out of hctz  Protocols used: Headache-A-AH

## 2024-03-24 NOTE — ED Provider Notes (Signed)
 [facility]    CSN: [id number] Arrival date & time: 03/24/24  1654      History   Chief Complaint No chief complaint on file.   HPI Robert Drake is a 38 y.o. male.   Patient presenting today with intermittent headaches, elevated blood pressure readings when checked at [facility].  States has been out of his hydrochlorothiazide  for 1-1/2 weeks and that this tends to happen when he runs out of his medication.  Denies chest pain, shortness of breath, visual change, mental status changes, nausea vomiting or diarrhea.  Has PCP follow-up scheduled for first week of August but was told to come here in the meantime.  Also requesting a refill of Dulera  and albuterol  for asthma.  Denies cough, wheezing, chest pain, shortness of breath.    Past Medical History:  Diagnosis Date   Asthma    Bronchitis    Bronchitis    Hypertension     There are no active problems to display for this patient.   History reviewed. No pertinent surgical history.     Home Medications    Prior to Admission medications   Medication Sig Start Date End Date Taking? Authorizing Provider  albuterol  (VENTOLIN  HFA) 108 (90 Base) MCG/ACT inhaler Inhale 2 puffs into the lungs every 4 (four) hours as needed. 03/24/24   Stuart Vernell Norris, PA-C  hydrochlorothiazide  (HYDRODIURIL ) 25 MG tablet Take 1 tablet (25 mg total) by mouth daily. 01/20/16   Sofia, Leslie K, PA-C  hydrochlorothiazide  (HYDRODIURIL ) 25 MG tablet Take 1 tablet (25 mg total) by mouth daily. 03/24/24   Stuart Vernell Norris, PA-C  ibuprofen  (ADVIL ,MOTRIN ) 200 MG tablet Take 400 mg by mouth every 6 (six) hours as needed for pain.    [provider]  mometasone-formoterol (DULERA ) 200-5 MCG/ACT AERO Inhale 2 puffs into the lungs 2 (two) times daily. 03/24/24   Stuart Vernell Norris, PA-C    Family History History reviewed. No pertinent family history.  Social History Social History   Tobacco Use   Smoking status: Former     Current packs/day: 0.03    Average packs/day: (0.1 ttl pk-yrs)    Types: Cigarettes   Smokeless tobacco: Never  Vaping Use   Vaping status: Never Used  Substance Use Topics   Alcohol use: No   Drug use: Yes    Types: Marijuana    Comment: occasionally     Allergies   Robitussin [guaifenesin]   Review of Systems Review of Systems PER HPI  Physical Exam Triage Vital Signs ED Triage Vitals  Encounter Vitals Group     BP 03/24/24 1706 (!) 165/97     Girls Systolic BP Percentile --      Girls Diastolic BP Percentile --      Boys Systolic BP Percentile --      Boys Diastolic BP Percentile --      Pulse Rate 03/24/24 1706 83     Resp 03/24/24 1706 18     Temp 03/24/24 1706 98.2 F (36.8 C)     Temp Source 03/24/24 1706 Oral     SpO2 03/24/24 1706 95 %     Weight --      Height --      Head Circumference --      Peak Flow --      Pain Score 03/24/24 1708 4     Pain Loc --      Pain Education --      Exclude from Growth Chart --  No data found.  Updated Vital Signs BP (!) 165/97 (BP Location: Right Arm)   Pulse 83   Temp 98.2 F (36.8 C) (Oral)   Resp 18   SpO2 95%   Visual Acuity Right Eye Distance:   Left Eye Distance:   Bilateral Distance:    Right Eye Near:   Left Eye Near:    Bilateral Near:     Physical Exam Vitals and nursing note reviewed.  Constitutional:      Appearance: Normal appearance.  HENT:     Head: Atraumatic.  Eyes:     Extraocular Movements: Extraocular movements intact.     Conjunctiva/sclera: Conjunctivae normal.  Cardiovascular:     Rate and Rhythm: Normal rate and regular rhythm.  Pulmonary:     Effort: Pulmonary effort is normal.     Breath sounds: Normal breath sounds.  Musculoskeletal:        General: Normal range of motion.     Cervical back: Normal range of motion and neck supple.  Skin:    General: Skin is warm and dry.  Neurological:     General: No focal deficit present.     Mental Status: He is oriented  to person, place, and time.  Psychiatric:        Mood and Affect: Mood normal.        Thought Content: Thought content normal.        Judgment: Judgment normal.      UC Treatments / Results  Labs (all labs ordered are listed, but only abnormal results are displayed) Labs Reviewed - No data to display  EKG   Radiology No results found.  Procedures Procedures (including critical care time)  Medications Ordered in UC Medications - No data to display  Initial Impression / Assessment and Plan / UC Course  I have reviewed the triage vital signs and the nursing notes.  Pertinent labs & imaging results that were available during my care of the patient were reviewed by me and considered in my medical decision making (see chart for details).     Dulera  and albuterol  inhalers as well as hydrochlorothiazide  for hypertension refilled today.  Continue close monitoring and follow-up as scheduled next month with primary care for a recheck and ongoing management.  Will forego lab monitoring today as he has upcoming PCP appointment for follow-up.  Return for worsening symptoms at any time. Final Clinical Impressions(s) / UC Diagnoses   Final diagnoses:  Essential hypertension  Moderate persistent asthma without complication     Discharge Instructions      I have refilled your inhalers and your blood pressure medication.  Keep a log of your home blood pressure readings and bring this to your primary care appointment.  Follow-up for any concerns in the meantime.    ED Prescriptions     Medication Sig Dispense Auth. Provider   albuterol  (VENTOLIN  HFA) 108 (90 Base) MCG/ACT inhaler  (Status: Discontinued) Inhale 2 puffs into the lungs every 4 (four) hours as needed. 1 each Stuart Vernell Norris, PA-C   mometasone-formoterol (DULERA ) 200-5 MCG/ACT AERO Inhale 2 puffs into the lungs 2 (two) times daily. 1 each Stuart Vernell Norris, PA-C   hydrochlorothiazide  (HYDRODIURIL ) 25 MG  tablet Take 1 tablet (25 mg total) by mouth daily. 30 tablet Stuart Vernell Norris, PA-C   albuterol  (VENTOLIN  HFA) 108 (90 Base) MCG/ACT inhaler Inhale 2 puffs into the lungs every 4 (four) hours as needed. 1 each Stuart Vernell Norris, PA-C  PDMP not reviewed this encounter.   Stuart Vernell Norris, [place] 03/24/24 1801

## 2024-03-24 NOTE — ED Triage Notes (Signed)
 Out of BP medication x 1.5 weeks.  Headache on Monday and Tuesday.  States bilateral lower leg swelling.  Needs a refill on hydrochlorothiazide  25mg  and Dulera .

## 2024-04-21 ENCOUNTER — Ambulatory Visit: Payer: Self-pay | Admitting: Nurse Practitioner

## 2024-08-05 ENCOUNTER — Ambulatory Visit: Payer: Self-pay | Admitting: Family Medicine

## 2024-11-18 ENCOUNTER — Ambulatory Visit: Admitting: Family Medicine
